# Patient Record
Sex: Female | Born: 1970 | Race: Black or African American | Hispanic: No | Marital: Single | Smoking: Never smoker
Health system: Southern US, Community
[De-identification: ages and names within clinical notes are randomized; demographics above are authoritative.]

## PROBLEM LIST (undated history)

## (undated) DIAGNOSIS — E785 Hyperlipidemia, unspecified: Secondary | ICD-10-CM

## (undated) DIAGNOSIS — I499 Cardiac arrhythmia, unspecified: Secondary | ICD-10-CM

## (undated) DIAGNOSIS — R011 Cardiac murmur, unspecified: Secondary | ICD-10-CM

## (undated) DIAGNOSIS — N189 Chronic kidney disease, unspecified: Secondary | ICD-10-CM

## (undated) DIAGNOSIS — E079 Disorder of thyroid, unspecified: Secondary | ICD-10-CM

## (undated) DIAGNOSIS — U071 COVID-19: Secondary | ICD-10-CM

## (undated) DIAGNOSIS — D649 Anemia, unspecified: Secondary | ICD-10-CM

## (undated) DIAGNOSIS — I10 Essential (primary) hypertension: Secondary | ICD-10-CM

## (undated) HISTORY — DX: Anemia, unspecified: D64.9

## (undated) HISTORY — DX: Cardiac murmur, unspecified: R01.1

## (undated) HISTORY — DX: Hyperlipidemia, unspecified: E78.5

## (undated) HISTORY — DX: Chronic kidney disease, unspecified: N18.9

## (undated) HISTORY — DX: Essential (primary) hypertension: I10

## (undated) HISTORY — DX: Disorder of thyroid, unspecified: E07.9

---

## 1997-03-06 HISTORY — PX: TOE SURGERY: SHX1073

## 1997-08-27 ENCOUNTER — Other Ambulatory Visit: Admission: RE | Admit: 1997-08-27 | Discharge: 1997-08-27 | Payer: Self-pay | Admitting: Obstetrics and Gynecology

## 1998-10-29 ENCOUNTER — Other Ambulatory Visit: Admission: RE | Admit: 1998-10-29 | Discharge: 1998-10-29 | Payer: Self-pay | Admitting: Obstetrics and Gynecology

## 1999-03-16 ENCOUNTER — Emergency Department (HOSPITAL_COMMUNITY): Admission: EM | Admit: 1999-03-16 | Discharge: 1999-03-16 | Payer: Self-pay | Admitting: Emergency Medicine

## 1999-07-11 ENCOUNTER — Emergency Department (HOSPITAL_COMMUNITY): Admission: EM | Admit: 1999-07-11 | Discharge: 1999-07-11 | Payer: Self-pay | Admitting: Emergency Medicine

## 2000-11-22 ENCOUNTER — Emergency Department (HOSPITAL_COMMUNITY): Admission: EM | Admit: 2000-11-22 | Discharge: 2000-11-22 | Payer: Self-pay | Admitting: Emergency Medicine

## 2001-07-03 ENCOUNTER — Emergency Department (HOSPITAL_COMMUNITY): Admission: EM | Admit: 2001-07-03 | Discharge: 2001-07-03 | Payer: Self-pay | Admitting: Emergency Medicine

## 2004-03-01 ENCOUNTER — Emergency Department: Payer: Self-pay | Admitting: Emergency Medicine

## 2004-03-01 ENCOUNTER — Ambulatory Visit: Payer: Self-pay | Admitting: Emergency Medicine

## 2005-01-19 ENCOUNTER — Emergency Department: Payer: Self-pay | Admitting: Emergency Medicine

## 2005-01-19 ENCOUNTER — Other Ambulatory Visit: Payer: Self-pay

## 2005-03-28 ENCOUNTER — Other Ambulatory Visit: Payer: Self-pay

## 2005-03-28 ENCOUNTER — Emergency Department: Payer: Self-pay | Admitting: Emergency Medicine

## 2005-06-22 ENCOUNTER — Emergency Department: Payer: Self-pay | Admitting: Emergency Medicine

## 2005-12-08 ENCOUNTER — Emergency Department: Payer: Self-pay | Admitting: Emergency Medicine

## 2005-12-11 ENCOUNTER — Emergency Department: Payer: Self-pay | Admitting: Unknown Physician Specialty

## 2005-12-19 ENCOUNTER — Emergency Department: Payer: Self-pay | Admitting: Emergency Medicine

## 2006-04-24 ENCOUNTER — Other Ambulatory Visit: Payer: Self-pay

## 2006-04-24 ENCOUNTER — Emergency Department: Payer: Self-pay | Admitting: Emergency Medicine

## 2006-04-26 ENCOUNTER — Ambulatory Visit: Payer: Self-pay | Admitting: Gastroenterology

## 2006-11-16 ENCOUNTER — Ambulatory Visit: Payer: Self-pay | Admitting: Family Medicine

## 2008-01-18 ENCOUNTER — Emergency Department: Payer: Self-pay | Admitting: Internal Medicine

## 2009-07-13 ENCOUNTER — Emergency Department: Payer: Self-pay | Admitting: Emergency Medicine

## 2009-08-17 ENCOUNTER — Encounter: Payer: Self-pay | Admitting: Orthopedic Surgery

## 2009-09-09 ENCOUNTER — Emergency Department: Payer: Self-pay | Admitting: Emergency Medicine

## 2010-02-08 ENCOUNTER — Emergency Department: Payer: Self-pay | Admitting: Internal Medicine

## 2011-06-16 ENCOUNTER — Emergency Department: Payer: Self-pay | Admitting: *Deleted

## 2011-10-27 DIAGNOSIS — E063 Autoimmune thyroiditis: Secondary | ICD-10-CM | POA: Insufficient documentation

## 2012-01-08 ENCOUNTER — Emergency Department: Payer: Self-pay | Admitting: Emergency Medicine

## 2013-11-25 ENCOUNTER — Emergency Department: Payer: Self-pay | Admitting: Emergency Medicine

## 2014-03-06 HISTORY — PX: CHOLECYSTECTOMY: SHX55

## 2014-03-11 ENCOUNTER — Ambulatory Visit: Payer: Self-pay | Admitting: Family Medicine

## 2014-04-14 DIAGNOSIS — R609 Edema, unspecified: Secondary | ICD-10-CM | POA: Insufficient documentation

## 2014-04-14 DIAGNOSIS — R600 Localized edema: Secondary | ICD-10-CM | POA: Insufficient documentation

## 2014-12-03 DIAGNOSIS — K760 Fatty (change of) liver, not elsewhere classified: Secondary | ICD-10-CM | POA: Insufficient documentation

## 2015-08-06 ENCOUNTER — Emergency Department
Admission: EM | Admit: 2015-08-06 | Discharge: 2015-08-06 | Disposition: A | Discharge status: Home / Self Care | Payer: BC Managed Care – PPO | Attending: Emergency Medicine | Admitting: Emergency Medicine

## 2015-08-06 DIAGNOSIS — J029 Acute pharyngitis, unspecified: Secondary | ICD-10-CM | POA: Insufficient documentation

## 2015-08-06 DIAGNOSIS — N898 Other specified noninflammatory disorders of vagina: Secondary | ICD-10-CM | POA: Diagnosis present

## 2015-08-06 DIAGNOSIS — R1030 Lower abdominal pain, unspecified: Secondary | ICD-10-CM | POA: Insufficient documentation

## 2015-08-06 LAB — CHLAMYDIA/NGC RT PCR (ARMC ONLY)
Chlamydia Tr: NOT DETECTED
N gonorrhoeae: NOT DETECTED

## 2015-08-06 LAB — URINALYSIS COMPLETE WITH MICROSCOPIC (ARMC ONLY)
BILIRUBIN URINE: NEGATIVE
Bacteria, UA: NONE SEEN
Glucose, UA: NEGATIVE mg/dL
KETONES UR: NEGATIVE mg/dL
Leukocytes, UA: NEGATIVE
NITRITE: NEGATIVE
Protein, ur: NEGATIVE mg/dL
Specific Gravity, Urine: 1.016 (ref 1.005–1.030)
WBC UA: NONE SEEN WBC/hpf (ref 0–5)
pH: 5 (ref 5.0–8.0)

## 2015-08-06 LAB — WET PREP, GENITAL
CLUE CELLS WET PREP: NONE SEEN
SPERM: NONE SEEN
TRICH WET PREP: NONE SEEN
WBC WET PREP: NONE SEEN
YEAST WET PREP: NONE SEEN

## 2015-08-06 LAB — POCT PREGNANCY, URINE: Preg Test, Ur: NEGATIVE

## 2015-08-06 MED ORDER — AZITHROMYCIN 500 MG PO TABS
1000.0000 mg | ORAL_TABLET | Freq: Every day | ORAL | Status: DC
  Administered 2015-08-06: 1000 mg via ORAL
  Filled 2015-08-06: qty 2

## 2015-08-06 MED ORDER — CEFTRIAXONE SODIUM 1 G IJ SOLR
500.0000 mg | Freq: Once | INTRAMUSCULAR | Status: AC
  Administered 2015-08-06: 500 mg via INTRAMUSCULAR
  Filled 2015-08-06: qty 10

## 2015-08-06 MED ORDER — LIDOCAINE HCL (PF) 1 % IJ SOLN
2.1000 mL | Freq: Once | INTRAMUSCULAR | Status: DC
  Filled 2015-08-06: qty 5

## 2015-08-06 NOTE — ED Notes (Signed)
Pt states that she thinks she may have an std, states watery discharge with some lower abd discomfort

## 2015-08-06 NOTE — Discharge Instructions (Signed)

## 2015-08-06 NOTE — ED Provider Notes (Signed)
[facility] Emergency Department Provider Note  ____________________________________________  Time seen: Approximately 3:08 PM  I have reviewed the triage vital signs and the nursing notes.   HISTORY  Chief Complaint Vaginal Discharge    HPI Kirsten Cruz is a 45 y.o. female who presents to the emergency department for evaluation of vaginal discharge. Potential STD. Watery discharge with some lower abdominal discomfort.She also has a sore throat and reports that she had oral sex with the same partner who potentially gave her an STD. She denies nausea, vomiting, or fever.   No past medical history on file.  There are no active problems to display for this patient.   No past surgical history on file.  No current outpatient prescriptions on file.  Allergies Morphine and related  No family history on file.  Social History Social History  Substance Use Topics  . Smoking status: Not on file  . Smokeless tobacco: Not on file  . Alcohol Use: Not on file    Review of Systems Constitutional: Negative for fever. Respiratory: Negative for shortness of breath or cough. Gastrointestinal: Positive for abdominal pain; negative for nausea , Negative for vomiting. Genitourinary: Negative for dysuria , positive for vaginal discharge. Musculoskeletal: Negative for back pain. Skin: Negative for rash. ____________________________________________   PHYSICAL EXAM:  VITAL SIGNS: ED Triage Vitals  Enc Vitals Group     BP 08/06/15 1448 124/78 mmHg     Pulse Rate 08/06/15 1448 76     Resp 08/06/15 1448 18     Temp 08/06/15 1448 99 F (37.2 C)     Temp Source 08/06/15 1448 Oral     SpO2 08/06/15 1448 99 %     Weight 08/06/15 1448 195 lb (88.451 kg)     Height 08/06/15 1448 5\' 5"  (1.651 m)     Head Cir --      Peak Flow --      Pain Score 08/06/15 1449 5     Pain Loc --      Pain Edu? --      Excl. in [place]? --     Constitutional: Alert and oriented.  Well appearing and in no acute distress. Eyes: Conjunctivae are normal. PERRL. EOMI. Head: Atraumatic. Nose: No congestion/rhinnorhea. Mouth/Throat: Mucous membranes are moist. Respiratory: Normal respiratory effort.  No retractions. Gastrointestinal: Suprapubic tenderness on palpation. Genitourinary: Pelvic exam: Yellow, thin discharge noted in vaginal vault. No exudate noted on vaginal walls. Cervix pink, non-friable with yellow discharge at os.  Musculoskeletal: No extremity tenderness nor edema.  Neurologic:  Normal speech and language. No gross focal neurologic deficits are appreciated. Speech is normal. No gait instability. Skin:  Skin is warm, dry and intact. No rash noted. Psychiatric: Mood and affect are normal. Speech and behavior are normal.  ____________________________________________   LABS (all labs ordered are listed, but only abnormal results are displayed)  Labs Reviewed  URINALYSIS COMPLETEWITH MICROSCOPIC ([place]) - Abnormal; Notable for the following:    Color, Urine YELLOW (*)    APPearance CLEAR (*)    Hgb urine dipstick 1+ (*)    Squamous Epithelial / LPF 0-5 (*)    All other components within normal limits  WET PREP, GENITAL  CHLAMYDIA/NGC RT PCR ([facility] ONLY)  GONOCOCCUS CULTURE  POC URINE PREG, ED  POCT PREGNANCY, URINE  POC URINE PREG, ED   ____________________________________________  RADIOLOGY   ____________________________________________   PROCEDURES  Procedure(s) performed:   ____________________________________________   INITIAL IMPRESSION / ASSESSMENT AND PLAN / ED  COURSE  Pertinent labs & imaging results that were available during my care of the patient were reviewed by me and considered in my medical decision making (see chart for details).  Wet prep and urinalysis unremarkable. Will treat with IM Rocephin and Azithromycin. GC Chlamydia culture sent to lab. Throat culture also requested.   She was advised to follow  up with PCP for symptoms that are not improving over the week. She was advised to return to the ER for symptoms that change or worsen if unable to schedule an appointment. ____________________________________________   FINAL CLINICAL IMPRESSION(S) / ED DIAGNOSES  Final diagnoses:  Vaginal discharge  Pharyngitis    Note:  This document was prepared using Dragon voice recognition software and may include unintentional dictation errors.   Victorino Dike, FNP 08/06/15 1649  Hinda Kehr, MD 08/06/15 2223

## 2015-08-09 LAB — GONOCOCCUS CULTURE: SPECIAL REQUESTS: NORMAL

## 2015-08-17 ENCOUNTER — Emergency Department
Admission: EM | Admit: 2015-08-17 | Discharge: 2015-08-17 | Disposition: A | Discharge status: Home / Self Care | Payer: BC Managed Care – PPO | Attending: Emergency Medicine | Admitting: Emergency Medicine

## 2015-08-17 ENCOUNTER — Emergency Department: Payer: BC Managed Care – PPO

## 2015-08-17 ENCOUNTER — Encounter: Payer: Self-pay | Admitting: Medical Oncology

## 2015-08-17 DIAGNOSIS — M549 Dorsalgia, unspecified: Secondary | ICD-10-CM | POA: Diagnosis present

## 2015-08-17 DIAGNOSIS — M546 Pain in thoracic spine: Secondary | ICD-10-CM

## 2015-08-17 HISTORY — DX: Cardiac arrhythmia, unspecified: I49.9

## 2015-08-17 LAB — COMPREHENSIVE METABOLIC PANEL
ALT: 13 U/L — ABNORMAL LOW (ref 14–54)
ANION GAP: 7 (ref 5–15)
AST: 18 U/L (ref 15–41)
Albumin: 3.6 g/dL (ref 3.5–5.0)
Alkaline Phosphatase: 59 U/L (ref 38–126)
BILIRUBIN TOTAL: 0.7 mg/dL (ref 0.3–1.2)
BUN: 12 mg/dL (ref 6–20)
CO2: 25 mmol/L (ref 22–32)
Calcium: 8.7 mg/dL — ABNORMAL LOW (ref 8.9–10.3)
Chloride: 105 mmol/L (ref 101–111)
Creatinine, Ser: 0.97 mg/dL (ref 0.44–1.00)
Glucose, Bld: 99 mg/dL (ref 65–99)
POTASSIUM: 3.5 mmol/L (ref 3.5–5.1)
Sodium: 137 mmol/L (ref 135–145)
TOTAL PROTEIN: 7.1 g/dL (ref 6.5–8.1)

## 2015-08-17 LAB — CBC
HEMATOCRIT: 35.8 % (ref 35.0–47.0)
Hemoglobin: 12.1 g/dL (ref 12.0–16.0)
MCH: 30.1 pg (ref 26.0–34.0)
MCHC: 33.8 g/dL (ref 32.0–36.0)
MCV: 89 fL (ref 80.0–100.0)
Platelets: 168 10*3/uL (ref 150–440)
RBC: 4.02 MIL/uL (ref 3.80–5.20)
RDW: 15.4 % — AB (ref 11.5–14.5)
WBC: 6.9 10*3/uL (ref 3.6–11.0)

## 2015-08-17 LAB — LIPASE, BLOOD: LIPASE: 26 U/L (ref 11–51)

## 2015-08-17 LAB — FIBRIN DERIVATIVES D-DIMER (ARMC ONLY): FIBRIN DERIVATIVES D-DIMER (ARMC): 300 (ref 0–499)

## 2015-08-17 LAB — TROPONIN I

## 2015-08-17 MED ORDER — CYCLOBENZAPRINE HCL 10 MG PO TABS: 10.0000 mg | ORAL_TABLET | Freq: Three times a day (TID) | ORAL | Status: AC | PRN

## 2015-08-17 MED ORDER — KETOROLAC TROMETHAMINE 30 MG/ML IJ SOLN
30.0000 mg | Freq: Once | INTRAMUSCULAR | Status: AC
  Administered 2015-08-17: 30 mg via INTRAVENOUS
  Filled 2015-08-17: qty 1

## 2015-08-17 MED ORDER — NAPROXEN 500 MG PO TABS: 500.0000 mg | ORAL_TABLET | Freq: Two times a day (BID) | ORAL | Status: DC

## 2015-08-17 MED ORDER — SODIUM CHLORIDE 0.9 % IV SOLN
1000.0000 mL | Freq: Once | INTRAVENOUS | Status: AC
  Administered 2015-08-17: 1000 mL via INTRAVENOUS

## 2015-08-17 NOTE — ED Provider Notes (Signed)
[facility] Emergency Department Provider Note  ____________________________________________    I have reviewed the triage vital signs and the nursing notes.   HISTORY  Chief Complaint Back Pain and Dizziness    HPI Kirsten Cruz is a 45 y.o. female who presents with right upper back pain. Patient reports she felt a burning in her right shoulder blade area last night, it was mild so she didn't think anything of it and went to sleep. She reports today she continues to have mild pain in the right shoulder blade area, she had a brief episode of radiation into the right posterior neck region denies chest pain. No pleurisy. No shortness of breath. No chest pain. Patient reports she felt lightheaded when she got a shower but feels better now. No recent travel. No calf pain or swelling. She has had a cholecystectomy in the past. No abdominal pain   Past Medical History  Diagnosis Date  . Irregular heart beat     There are no active problems to display for this patient.   No past surgical history on file.  No current outpatient prescriptions on file.  Allergies Morphine and related  No family history on file.  Social History Social History  Substance Use Topics  . Smoking status: Never Smoker   . Smokeless tobacco: None  . Alcohol Use: No    Review of Systems  Constitutional: Negative for fever. Eyes: Negative for redness ENT: Negative for sore throat Cardiovascular: Negative for chest pain Respiratory: Negative for shortness of breath. Gastrointestinal: Negative for abdominal pain Genitourinary: Negative for dysuria. Musculoskeletal: As above Skin: Negative for rash. Neurological: Negative for focal weakness Psychiatric: Mild anxiety    ____________________________________________   PHYSICAL EXAM:  VITAL SIGNS: ED Triage Vitals  Enc Vitals Group     BP 08/17/15 0803 127/82 mmHg     Pulse Rate 08/17/15 0803 67     Resp 08/17/15  0803 16     Temp 08/17/15 0803 98 F (36.7 C)     Temp Source 08/17/15 0803 Oral     SpO2 08/17/15 0803 100 %     Weight 08/17/15 0803 195 lb (88.451 kg)     Height 08/17/15 0803 5\' 5"  (1.651 m)     Head Cir --      Peak Flow --      Pain Score 08/17/15 0804 7     Pain Loc --      Pain Edu? --      Excl. in [place]? --      Constitutional: Alert and oriented. Well appearing and in no distress.  Eyes: Conjunctivae are normal. No erythema or injection ENT   Head: Normocephalic and atraumatic.   Mouth/Throat: Mucous membranes are moist. Cardiovascular: Normal rate, regular rhythm. Normal and symmetric distal pulses are present in the upper extremities.  Respiratory: Normal respiratory effort without tachypnea nor retractions. Breath sounds are clear and equal bilaterally.  Gastrointestinal: Soft and non-tender in all quadrants. No distention. There is no CVA tenderness. Genitourinary: deferred Musculoskeletal: Nontender with normal range of motion in all extremities. No tenderness to palpation along the scapula or paraspinally. No obvious muscle spasm. No pain with range of motion of right arm Neurologic:  Normal speech and language. No gross focal neurologic deficits are appreciated. Skin:  Skin is warm, dry and intact. No rash noted. Psychiatric: Mood and affect are normal. Patient exhibits appropriate insight and judgment.  ____________________________________________    LABS (pertinent positives/negatives)  Labs Reviewed  CBC  COMPREHENSIVE METABOLIC PANEL  TROPONIN I  LIPASE, BLOOD  FIBRIN DERIVATIVES D-DIMER ([facility] ONLY)    ____________________________________________   EKG  ED ECG REPORT I, Lavonia Drafts, the attending physician, personally viewed and interpreted this ECG.  Date: 08/17/2015 EKG Time: 8:50 AM Rate: 63 Rhythm: normal sinus rhythm QRS Axis: normal Intervals: normal ST/T Wave abnormalities: normal Conduction Disturbances: none Narrative  Interpretation: unremarkable   ____________________________________________    RADIOLOGY  Chest x-ray normal  ____________________________________________   PROCEDURES  Procedure(s) performed: none  Critical Care performed: none  ____________________________________________   INITIAL IMPRESSION / ASSESSMENT AND PLAN / ED COURSE  Pertinent labs & imaging results that were available during my care of the patient were reviewed by me and considered in my medical decision making (see chart for details).  Patient presents with somewhat vague right upper back pain. Overall she is well-appearing. Not consistent with ACS, nor dissection given no chest pain and well-appearing, normal EKG and young age. Unlikely to be PE but we will send d-dimer.  Patient's workup was unremarkable. She improved with Toradol IV. Suspect musculoskeletal cause of her back pain. She has follow-up with her PCP. We discussed return precautions including any development of chest pain or shortness of breath or worsening pain  ____________________________________________   FINAL CLINICAL IMPRESSION(S) / ED DIAGNOSES  Final diagnoses:  Right-sided thoracic back pain          Lavonia Drafts, MD 08/17/15 1404

## 2015-08-17 NOTE — ED Notes (Signed)
Pt reports she began having a pain to the rt shoulder blade that radiates into rt arm and neck last night. Pt reports that she feels nauseous and like she is going to "pass out".

## 2015-08-17 NOTE — Discharge Instructions (Signed)

## 2015-12-22 ENCOUNTER — Other Ambulatory Visit: Payer: Self-pay | Admitting: Family Medicine

## 2015-12-22 DIAGNOSIS — Z1231 Encounter for screening mammogram for malignant neoplasm of breast: Secondary | ICD-10-CM

## 2015-12-23 DIAGNOSIS — K769 Liver disease, unspecified: Secondary | ICD-10-CM | POA: Insufficient documentation

## 2016-01-04 ENCOUNTER — Ambulatory Visit
Admission: RE | Admit: 2016-01-04 | Discharge: 2016-01-04 | Disposition: A | Discharge status: Home / Self Care | Payer: BC Managed Care – PPO | Source: Ambulatory Visit | Attending: Family Medicine | Admitting: Family Medicine

## 2016-01-04 DIAGNOSIS — Z1231 Encounter for screening mammogram for malignant neoplasm of breast: Secondary | ICD-10-CM | POA: Diagnosis not present

## 2016-08-03 DIAGNOSIS — F419 Anxiety disorder, unspecified: Secondary | ICD-10-CM | POA: Insufficient documentation

## 2016-08-07 ENCOUNTER — Other Ambulatory Visit: Payer: Self-pay | Admitting: Gastroenterology

## 2016-08-07 DIAGNOSIS — R935 Abnormal findings on diagnostic imaging of other abdominal regions, including retroperitoneum: Secondary | ICD-10-CM

## 2016-08-07 DIAGNOSIS — K760 Fatty (change of) liver, not elsewhere classified: Secondary | ICD-10-CM

## 2016-08-16 ENCOUNTER — Ambulatory Visit: Payer: BC Managed Care – PPO

## 2016-08-31 ENCOUNTER — Ambulatory Visit: Payer: BC Managed Care – PPO

## 2016-11-24 ENCOUNTER — Encounter (INDEPENDENT_AMBULATORY_CARE_PROVIDER_SITE_OTHER): Payer: Self-pay | Admitting: Vascular Surgery

## 2016-12-26 ENCOUNTER — Encounter (INDEPENDENT_AMBULATORY_CARE_PROVIDER_SITE_OTHER): Payer: Self-pay | Admitting: Vascular Surgery

## 2016-12-26 LAB — HM PAP SMEAR: HM Pap smear: NEGATIVE

## 2016-12-27 DIAGNOSIS — N183 Chronic kidney disease, stage 3 unspecified: Secondary | ICD-10-CM | POA: Insufficient documentation

## 2016-12-27 DIAGNOSIS — E7800 Pure hypercholesterolemia, unspecified: Secondary | ICD-10-CM | POA: Insufficient documentation

## 2017-02-09 ENCOUNTER — Other Ambulatory Visit: Payer: Self-pay | Admitting: Family Medicine

## 2017-02-09 DIAGNOSIS — Z1231 Encounter for screening mammogram for malignant neoplasm of breast: Secondary | ICD-10-CM

## 2017-03-12 ENCOUNTER — Ambulatory Visit
Admission: RE | Admit: 2017-03-12 | Discharge: 2017-03-12 | Disposition: A | Discharge status: Home / Self Care | Payer: BC Managed Care – PPO | Source: Ambulatory Visit | Attending: Family Medicine | Admitting: Family Medicine

## 2017-03-12 DIAGNOSIS — Z1231 Encounter for screening mammogram for malignant neoplasm of breast: Secondary | ICD-10-CM | POA: Diagnosis present

## 2017-07-05 ENCOUNTER — Other Ambulatory Visit: Payer: Self-pay

## 2017-07-05 ENCOUNTER — Ambulatory Visit (INDEPENDENT_AMBULATORY_CARE_PROVIDER_SITE_OTHER): Payer: BC Managed Care – PPO | Admitting: Vascular Surgery

## 2017-07-05 ENCOUNTER — Encounter (INDEPENDENT_AMBULATORY_CARE_PROVIDER_SITE_OTHER): Payer: Self-pay | Admitting: Vascular Surgery

## 2017-07-05 VITALS — BP 111/65 | HR 74 | Resp 20 | Ht 65.0 in | Wt 200.0 lb

## 2017-07-05 DIAGNOSIS — E049 Nontoxic goiter, unspecified: Secondary | ICD-10-CM | POA: Insufficient documentation

## 2017-07-05 DIAGNOSIS — E7800 Pure hypercholesterolemia, unspecified: Secondary | ICD-10-CM | POA: Diagnosis not present

## 2017-07-05 DIAGNOSIS — E785 Hyperlipidemia, unspecified: Secondary | ICD-10-CM | POA: Insufficient documentation

## 2017-07-05 DIAGNOSIS — R609 Edema, unspecified: Secondary | ICD-10-CM

## 2017-07-05 DIAGNOSIS — G43909 Migraine, unspecified, not intractable, without status migrainosus: Secondary | ICD-10-CM | POA: Insufficient documentation

## 2017-07-05 DIAGNOSIS — N183 Chronic kidney disease, stage 3 unspecified: Secondary | ICD-10-CM

## 2017-07-05 DIAGNOSIS — M79604 Pain in right leg: Secondary | ICD-10-CM

## 2017-07-05 DIAGNOSIS — B009 Herpesviral infection, unspecified: Secondary | ICD-10-CM | POA: Insufficient documentation

## 2017-07-05 DIAGNOSIS — M79605 Pain in left leg: Secondary | ICD-10-CM

## 2017-07-05 NOTE — Progress Notes (Signed)
Subjective:    Patient ID: Kirsten Cruz, female    DOB: January 13, 1971, 47 y.o.   MRN: [id number] Chief Complaint  Patient presents with  . Leg Swelling    bilateral   Presents as a new patient referred by Dr. Astrid Divine for evaluation of bilateral lower extremity edema and discomfort.  The patient endorses a history of 7 to 8 months of progressively worsening discomfort to the bilateral lower extremity.  The patient's discomfort is located behind the knees to the proximal calf.  The patient notes that her discomfort occurs when she experiences swelling to the bilateral legs.  The patient notes her swelling occurs throughout the day worsening towards the evening, after working out or sitting or standing for long periods of time.  At this time, the patient does not engage in conservative therapy including wearing a grade 1 compression socks.  The patient does elevate her legs.  The patient denies any recent trauma or surgery to the bilateral lower extremity.  The patient denies any claudication-like symptoms, rest pain or ulceration to the bilateral lower extremity.  The patient denies any fever, nausea vomiting.  The patient does feel that her symptoms have progressed to the point that they have become lifestyle limiting.  This is what prompted her to seek medical attention.  Review of Systems  Constitutional: Negative.   HENT: Negative.   Eyes: Negative.   Respiratory: Negative.   Cardiovascular: Positive for leg swelling.       Lower extremity discomfort  Gastrointestinal: Negative.   Endocrine: Negative.   Genitourinary: Negative.   Musculoskeletal: Negative.   Skin: Negative.   Allergic/Immunologic: Negative.   Neurological: Negative.   Hematological: Negative.   Psychiatric/Behavioral: Negative.       Objective:   Physical Exam  Constitutional: She is oriented to person, place, and time. She appears well-developed and well-nourished. No distress.  HENT:  Head: Normocephalic and  atraumatic.  Right Ear: External ear normal.  Left Ear: External ear normal.  Eyes: Pupils are equal, round, and reactive to light. Conjunctivae and EOM are normal.  Neck: Normal range of motion.  Cardiovascular: Normal rate, regular rhythm, normal heart sounds and intact distal pulses.  Pulses:      Radial pulses are 2+ on the right side, and 2+ on the left side.       Dorsalis pedis pulses are 2+ on the right side, and 2+ on the left side.       Posterior tibial pulses are 2+ on the right side, and 2+ on the left side.  Pulmonary/Chest: Effort normal and breath sounds normal.  Musculoskeletal: Normal range of motion. She exhibits edema (Minimal nonpitting edema noted bilaterally).  Neurological: She is alert and oriented to person, place, and time.  Skin: Skin is warm and dry. Capillary refill takes 2 to 3 seconds. She is not diaphoretic.  Less than 1 cm scattered varicosities to the bilateral lower extremity.  Mostly located to the back of the knees.  There is no stasis dermatitis, skin changes, cellulitis or open ulcerations to the bilateral lower extremity.  Psychiatric: She has a normal mood and affect. Her behavior is normal. Judgment and thought content normal.  Vitals reviewed.  BP 111/65 (BP Location: Right Arm)   Pulse 74   Resp 20   Ht 5\' 5"  (1.651 m)   Wt 200 lb (90.7 kg)   BMI 33.28 kg/m   Past Medical History:  Diagnosis Date  . Anemia   . Chronic kidney disease   .  Heart murmur    many years ago and her cardiologist Dr. Clayborn Bigness is managing this  . Hyperlipidemia   . Hypertension   . Irregular heart beat   . Thyroid disease    Social History   Socioeconomic History  . Marital status: Single    Spouse name: Not on file  . Number of children: Not on file  . Years of education: Not on file  . Highest education level: Not on file  Occupational History  . Not on file  Social Needs  . Financial resource strain: Not on file  . Food insecurity:    Worry: Not  on file    Inability: Not on file  . Transportation needs:    Medical: Not on file    Non-medical: Not on file  Tobacco Use  . Smoking status: Never Smoker  . Smokeless tobacco: Never Used  Substance and Sexual Activity  . Alcohol use: No  . Drug use: Not on file  . Sexual activity: Not on file  Lifestyle  . Physical activity:    Days per week: Not on file    Minutes per session: Not on file  . Stress: Not on file  Relationships  . Social connections:    Talks on phone: Not on file    Gets together: Not on file    Attends religious service: Not on file    Active member of club or organization: Not on file    Attends meetings of clubs or organizations: Not on file    Relationship status: Not on file  . Intimate partner violence:    Fear of current or ex partner: Not on file    Emotionally abused: Not on file    Physically abused: Not on file    Forced sexual activity: Not on file  Other Topics Concern  . Not on file  Social History Narrative  . Not on file   Past Surgical History:  Procedure Laterality Date  . CESAREAN SECTION  02/11/1997  . CHOLECYSTECTOMY  2016  . TOE SURGERY Left 1999   Family History  Problem Relation Age of Onset  . Breast cancer Mother 67  . Cancer Mother   . Diabetes Mother   . Varicose Veins Mother   . Cancer Father   . Diabetes Sister    Allergies  Allergen Reactions  . Buspirone Nausea Only    Other reaction(s): Headache  . Pravastatin Other (See Comments)    Pt believes she had a seizure with it.  . Morphine Rash  . Morphine And Related Rash      Assessment & Plan:  Presents as a new patient referred by Dr. Astrid Divine for evaluation of bilateral lower extremity edema and discomfort.  The patient endorses a history of 7 to 8 months of progressively worsening discomfort to the bilateral lower extremity.  The patient's discomfort is located behind the knees to the proximal calf.  The patient notes that her discomfort occurs when she  experiences swelling to the bilateral legs.  The patient notes her swelling occurs throughout the day worsening towards the evening, after working out or sitting or standing for long periods of time.  At this time, the patient does not engage in conservative therapy including wearing a grade 1 compression socks.  The patient does elevate her legs.  The patient denies any recent trauma or surgery to the bilateral lower extremity.  The patient denies any claudication-like symptoms, rest pain or ulceration to the bilateral lower extremity.  The patient denies any fever, nausea vomiting.  The patient does feel that her symptoms have progressed to the point that they have become lifestyle limiting.  This is what prompted her to seek medical attention.  1. Edema, peripheral - New The patient was encouraged to wear graduated compression stockings (20-30 mmHg) on a daily basis. The patient was instructed to begin wearing the stockings first thing in the morning and removing them in the evening. The patient was instructed specifically not to sleep in the stockings. Prescription given.  In addition, behavioral modification including elevation during the day will be initiated. Anti-inflammatories for pain. The patient will follow up in three months to asses conservative management.  Information on compression stockings was given to the patient. The patient was instructed to call the office in the interim if any worsening edema or ulcerations to the legs, feet or toes occurs. The patient expresses their understanding.  - VAS [place] LOWER EXTREMITY VENOUS REFLUX; Future  2. Lower extremity pain, bilateral - New I do not feel the patient's discomfort is from arterial insufficiency as the patient has 2+ pedal pulses to her bilateral feet and her symptoms do not sound ischemic in nature.  3. CKD (chronic kidney disease) stage 3, GFR 30-59 ml/min (HCC) - Stable Encouraged good hypertension control as this directly  relates to worsening kidney function  4. Pure hypercholesterolemia - Stable On statin Encouraged good control as its slows the progression of atherosclerotic disease  5. Hyperlipidemia, unspecified hyperlipidemia type - Stable On statin Encouraged good control as its slows the progression of atherosclerotic disease  Current Outpatient Medications on File Prior to Visit  Medication Sig Dispense Refill  . B Complex Vitamins (B-COMPLEX/B-12) TABS Per MD, to help with trending lower b12 levels    . clonazePAM (KLONOPIN) 0.5 MG tablet TAKE 1 TABLET BY MOUTH ONCE A DAY AS NEEDED FOR ANXIETY FOR UP TO 1 DOSE    . cyclobenzaprine (FLEXERIL) 10 MG tablet cyclobenzaprine 10 mg tablet    . metoprolol tartrate (LOPRESSOR) 25 MG tablet Take by mouth.    . SUMAtriptan (IMITREX) 100 MG tablet Take by mouth.    . triamterene-hydrochlorothiazide (DYAZIDE) 37.5-25 MG capsule Take by mouth.    . valACYclovir (VALTREX) 500 MG tablet One po bid for flare x 3 days    . atorvastatin (LIPITOR) 40 MG tablet Take 40 mg by mouth daily.  3  . escitalopram (LEXAPRO) 10 MG tablet   11  . metroNIDAZOLE (METROGEL) 0.75 % vaginal gel metronidazole 0.75 % vaginal gel    . naproxen (NAPROSYN) 500 MG tablet Take 1 tablet (500 mg total) by mouth 2 (two) times daily with a meal. 20 tablet 2  . SUMAtriptan (IMITREX) 100 MG tablet   10   No current facility-administered medications on file prior to visit.    There are no Patient Instructions on file for this visit. No follow-ups on file.  Thaddus Mcdowell A Mireya Meditz, PA-C

## 2017-07-27 ENCOUNTER — Ambulatory Visit (INDEPENDENT_AMBULATORY_CARE_PROVIDER_SITE_OTHER): Payer: BC Managed Care – PPO | Admitting: Obstetrics and Gynecology

## 2017-07-27 ENCOUNTER — Encounter: Payer: Self-pay | Admitting: Obstetrics and Gynecology

## 2017-07-27 VITALS — BP 118/70 | HR 71 | Ht 65.0 in | Wt 199.0 lb

## 2017-07-27 DIAGNOSIS — N920 Excessive and frequent menstruation with regular cycle: Secondary | ICD-10-CM | POA: Diagnosis not present

## 2017-07-27 DIAGNOSIS — Z0000 Encounter for general adult medical examination without abnormal findings: Secondary | ICD-10-CM

## 2017-07-27 DIAGNOSIS — Z01419 Encounter for gynecological examination (general) (routine) without abnormal findings: Secondary | ICD-10-CM | POA: Diagnosis not present

## 2017-07-27 HISTORY — PX: OTHER SURGICAL HISTORY: SHX169

## 2017-07-27 MED ORDER — LEVONORGESTREL 20 MCG/24HR IU IUD: 1.0000 | INTRAUTERINE_SYSTEM | Freq: Once | INTRAUTERINE | 0 refills | Status: DC

## 2017-07-27 NOTE — Progress Notes (Signed)
Gynecology Annual Exam  PCP: Gayland Curry, MD  Chief Complaint  Patient presents with  . Contraception    [facility] conference   Annual gynecologic exam  History of Present Illness:  [name] is a 47 y.o. [phone] who LMP was Patient's last menstrual period was 07/09/2017 (exact date)., presents today for her annual examination.   She does not have vasomotor sx.   Hx of STDs: HSV, distant history of genital warts  Last mammogram: 1 year  Results were: normal--routine follow-up in 12 months There is a FH of breast cancer in her mother (age 37). There is no FH of ovarian cancer. The patient does do self-breast exams.  Tobacco use: The patient denies current or previous tobacco use. Alcohol use: social drinker Exercise: moderately active  The patient wears seatbelts: yes.     Paitient is a 47 y.o. [id number] who LMP was Patient's last menstrual period was 07/09/2017 (exact date)., presents today for a problem visit.  Her periods are irregular. It is hard to predict when it the menses will come.  But, they usually come monthly.  They will last 7-10 days.  She passes clots.  She uses 2-3 pads per day. She denies intermenstrual spotting.   Previous evaluation: 3 years. Prior Diagnosis: abnormal bleeding of unknown origin. Previous Treatment: NuvaRing, patch, pills.  LMP: Patient's last menstrual period was 07/09/2017 (exact date). Menarche:12 Postcoital Bleeding: no Dysmenorrhea: no  Last pap smear was 12/2016, NILM, HPV negative Ultrasound in late 2016 was not suggestive of any particular etiology.  The patient has not had sex since before her last menses.   She gets her breast exams at [facility] in [place].    Past Medical History:  Diagnosis Date  . Anemia   . Chronic kidney disease   . Heart murmur    many years ago and her cardiologist Dr. Clayborn Bigness is managing this  . Hyperlipidemia   . Hypertension   . Irregular heart beat   . Thyroid disease    Past  Surgical History:  Procedure Laterality Date  . CESAREAN SECTION  02/11/1997  . CHOLECYSTECTOMY  2016  . TOE SURGERY Left 1999   Prior to Admission medications   Medication Sig Start Date End Date Taking? Authorizing Provider  atorvastatin (LIPITOR) 40 MG tablet Take 40 mg by mouth daily. 06/19/17  Yes [provider]  clonazePAM (KLONOPIN) 0.5 MG tablet TAKE 1 TABLET BY MOUTH ONCE A DAY AS NEEDED FOR ANXIETY FOR UP TO 1 DOSE 11/16/16 09/30/17 Yes [provider]  metoprolol tartrate (LOPRESSOR) 25 MG tablet Take by mouth. 12/26/16 12/26/17 Yes [provider]  ondansetron (ZOFRAN-ODT) 4 MG disintegrating tablet DIS 1 TO 2 TS ON THE TONGUE TID PRF NAUSEA 07/16/17  Yes [provider]  SUMAtriptan (IMITREX) 100 MG tablet Take by mouth. 04/14/14  Yes [provider]  SUMAtriptan (IMITREX) 100 MG tablet  06/23/17  Yes [provider]  topiramate (TOPAMAX) 50 MG tablet Take by mouth. 07/16/17 07/16/18 Yes [provider]  triamterene-hydrochlorothiazide (DYAZIDE) 37.5-25 MG capsule Take by mouth. 12/26/16 12/26/17 Yes [provider]  valACYclovir (VALTREX) 500 MG tablet One po bid for flare x 3 days 04/06/17  Yes [provider]  B Complex Vitamins (B-COMPLEX/B-12) TABS Per MD, to help with trending lower b12 levels 06/28/17   [provider]  cyclobenzaprine (FLEXERIL) 10 MG tablet cyclobenzaprine 10 mg tablet 03/07/16   [provider]  escitalopram (LEXAPRO) 10 MG tablet  06/27/17  [provider]  metroNIDAZOLE (METROGEL) 0.75 % vaginal gel metronidazole 0.75 % vaginal gel    [provider]   Allergies  Allergen Reactions  . Buspirone Nausea Only    Other reaction(s): Headache  . Pravastatin Other (See Comments)    Pt believes she had a seizure with it.  . Morphine Rash  . Morphine And Related Rash   Obstetric History: [id number]  Family History  Problem Relation Age of Onset  .  Breast cancer Mother 72  . Cancer Mother   . Diabetes Mother   . Varicose Veins Mother   . Cancer Father   . Diabetes Sister     Social History   Socioeconomic History  . Marital status: Single    Spouse name: Not on file  . Number of children: Not on file  . Years of education: Not on file  . Highest education level: Not on file  Occupational History  . Not on file  Social Needs  . Financial resource strain: Not on file  . Food insecurity:    Worry: Not on file    Inability: Not on file  . Transportation needs:    Medical: Not on file    Non-medical: Not on file  Tobacco Use  . Smoking status: Never Smoker  . Smokeless tobacco: Never Used  Substance and Sexual Activity  . Alcohol use: No  . Drug use: Never  . Sexual activity: Not Currently    Birth control/protection: Condom  Lifestyle  . Physical activity:    Days per week: Not on file    Minutes per session: Not on file  . Stress: Not on file  Relationships  . Social connections:    Talks on phone: Not on file    Gets together: Not on file    Attends religious service: Not on file    Active member of club or organization: Not on file    Attends meetings of clubs or organizations: Not on file    Relationship status: Not on file  . Intimate partner violence:    Fear of current or ex partner: Not on file    Emotionally abused: Not on file    Physically abused: Not on file    Forced sexual activity: Not on file  Other Topics Concern  . Not on file  Social History Narrative  . Not on file    Review of Systems  Constitutional: Negative.   HENT: Negative.   Eyes: Negative.   Respiratory: Negative.   Cardiovascular: Negative.   Gastrointestinal: Negative.   Genitourinary: Negative.   Musculoskeletal: Negative.   Skin: Negative.   Neurological: Negative.   Psychiatric/Behavioral: Negative.      Physical Exam BP 118/70   Pulse 71   Ht 5\' 5"  (1.651 m)   Wt 199 lb (90.3 kg)   LMP 07/09/2017 (Exact  Date)   BMI 33.12 kg/m   Physical Exam  Constitutional: She is oriented to person, place, and time. She appears well-developed and well-nourished. No distress.  Genitourinary: Vagina normal and uterus normal. Pelvic exam was performed with patient supine. There is no rash, tenderness or lesion on the right labia. There is no rash, tenderness or lesion on the left labia. Right adnexum does not display mass, does not display tenderness and does not display fullness. Left adnexum does not display mass, does not display tenderness and does not display fullness. Cervix does not exhibit motion tenderness, lesion or polyp.   Uterus is mobile and anteverted.  Uterus is not enlarged, tender, exhibiting a mass or irregular (is regular).  HENT:  Head: Normocephalic and atraumatic.  Eyes: EOM are normal. No scleral icterus.  Neck: Normal range of motion. Neck supple. No thyromegaly present.  Cardiovascular: Normal rate and regular rhythm.  Pulmonary/Chest: Effort normal and breath sounds normal. No respiratory distress. She has no wheezes. She has no rales.  Abdominal: Soft. Bowel sounds are normal. She exhibits no distension and no mass. There is no tenderness. There is no rebound and no guarding.  Musculoskeletal: Normal range of motion. She exhibits no edema.  Lymphadenopathy:    She has no cervical adenopathy.  Neurological: She is alert and oriented to person, place, and time. No cranial nerve deficit.  Skin: Skin is warm and dry. No erythema.  Psychiatric: She has a normal mood and affect. Her behavior is normal. Judgment normal.   IUD Insertion Procedure Note (Mirena) Patient identified, informed consent performed, consent signed.   Discussed risks of irregular bleeding, cramping, infection, malpositioning, expulsion or uterine perforation of the IUD (1:1000 placements)  which may require further procedure such as laparoscopy.  IUD while effective at preventing pregnancy do not prevent transmission  of sexually transmitted diseases and use of barrier methods for this purpose was discussed. Time out was performed.  Urine pregnancy test negative.  Speculum placed in the vagina.  Cervix visualized.  Cleaned with Betadine x 2.  Grasped anteriorly with a single tooth tenaculum.  Uterus sounded to 9 cm. IUD placed per manufacturer's recommendations.  Strings trimmed to 3 cm. Tenaculum was removed, good hemostasis noted.  Patient tolerated procedure well.   Patient was given post-procedure instructions.  She was advised to have backup contraception for one week.  Patient was also asked to check IUD strings periodically and follow up in 4 weeks for IUD check.   Female chaperone present for pelvic and breast  portions of the physical exam  Assessment: 47 y.o. [id number] female here for routine gynecologic examination, menorrhagia with resultant anemia.  Plan: Problem List Items Addressed This Visit      Other   Menorrhagia with regular cycle (Chronic)    Other Visit Diagnoses    Women's annual routine gynecological examination    -  Primary     Screening: -- Blood pressure screen managed by PCP -- Colonoscopy - not due -- Mammogram - done in 03/2017, Birads 1, managed by PCP -- Weight screening: managed by PCP -- Depression screening negative (PHQ-9) -- Nutrition: normal -- cholesterol screening: per PCP -- osteoporosis screening: not due -- tobacco screening: not using -- alcohol screening: AUDIT questionnaire indicates low-risk usage. -- family history of breast cancer screening: done. not at high risk. -- no evidence of domestic violence or intimate partner violence. -- STD screening: gonorrhea/chlamydia NAAT not collected per patient request. -- pap smear not collected per ASCCP guidelines -- HPV vaccination series: not eligilbe   Menorrhagia: IUD placed today after discussion of options to treat heavy bleeding. Precautions given.  Will see her in follow-up in one month to check  strings and response to therapy.   Prentice Docker, MD 07/27/2017 5:32 PM

## 2017-07-27 NOTE — Progress Notes (Deleted)
Obstetrics & Gynecology Office Visit   Chief Complaint  Patient presents with  . Contraception    [facility] conference    History of Present Illness:     Past Medical History:  Diagnosis Date  . Anemia   . Chronic kidney disease   . Heart murmur    many years ago and her cardiologist Dr. Clayborn Bigness is managing this  . Hyperlipidemia   . Hypertension   . Irregular heart beat   . Thyroid disease     Past Surgical History:  Procedure Laterality Date  . CESAREAN SECTION  02/11/1997  . CHOLECYSTECTOMY  2016  . TOE SURGERY Left 1999    Gynecologic History: Patient's last menstrual period was 07/09/2017 (exact date).  Obstetric History: [id number]  Family History  Problem Relation Age of Onset  . Breast cancer Mother 11  . Cancer Mother   . Diabetes Mother   . Varicose Veins Mother   . Cancer Father   . Diabetes Sister     Social History   Socioeconomic History  . Marital status: Single    Spouse name: Not on file  . Number of children: Not on file  . Years of education: Not on file  . Highest education level: Not on file  Occupational History  . Not on file  Social Needs  . Financial resource strain: Not on file  . Food insecurity:    Worry: Not on file    Inability: Not on file  . Transportation needs:    Medical: Not on file    Non-medical: Not on file  Tobacco Use  . Smoking status: Never Smoker  . Smokeless tobacco: Never Used  Substance and Sexual Activity  . Alcohol use: No  . Drug use: Never  . Sexual activity: Not Currently    Birth control/protection: Condom  Lifestyle  . Physical activity:    Days per week: Not on file    Minutes per session: Not on file  . Stress: Not on file  Relationships  . Social connections:    Talks on phone: Not on file    Gets together: Not on file    Attends religious service: Not on file    Active member of club or organization: Not on file    Attends meetings of clubs or organizations: Not on file   Relationship status: Not on file  . Intimate partner violence:    Fear of current or ex partner: Not on file    Emotionally abused: Not on file    Physically abused: Not on file    Forced sexual activity: Not on file  Other Topics Concern  . Not on file  Social History Narrative  . Not on file    Allergies  Allergen Reactions  . Buspirone Nausea Only    Other reaction(s): Headache  . Pravastatin Other (See Comments)    Pt believes she had a seizure with it.  . Morphine Rash  . Morphine And Related Rash    Prior to Admission medications   Medication Sig Start Date End Date Taking? Authorizing Provider  atorvastatin (LIPITOR) 40 MG tablet Take 40 mg by mouth daily. 06/19/17  Yes [provider]  clonazePAM (KLONOPIN) 0.5 MG tablet TAKE 1 TABLET BY MOUTH ONCE A DAY AS NEEDED FOR ANXIETY FOR UP TO 1 DOSE 11/16/16 09/30/17 Yes [provider]  metoprolol tartrate (LOPRESSOR) 25 MG tablet Take by mouth. 12/26/16 12/26/17 Yes [provider]  ondansetron (ZOFRAN-ODT) 4 MG disintegrating tablet DIS  1 TO 2 TS ON THE TONGUE TID PRF NAUSEA 07/16/17  Yes [provider]  SUMAtriptan (IMITREX) 100 MG tablet Take by mouth. 04/14/14  Yes [provider]  SUMAtriptan (IMITREX) 100 MG tablet  06/23/17  Yes [provider]  topiramate (TOPAMAX) 50 MG tablet Take by mouth. 07/16/17 07/16/18 Yes [provider]  triamterene-hydrochlorothiazide (DYAZIDE) 37.5-25 MG capsule Take by mouth. 12/26/16 12/26/17 Yes [provider]  valACYclovir (VALTREX) 500 MG tablet One po bid for flare x 3 days 04/06/17  Yes [provider]  B Complex Vitamins (B-COMPLEX/B-12) TABS Per MD, to help with trending lower b12 levels 06/28/17   [provider]  cyclobenzaprine (FLEXERIL) 10 MG tablet cyclobenzaprine 10 mg tablet 03/07/16   [provider]  escitalopram (LEXAPRO) 10 MG tablet  06/27/17   [provider]  metroNIDAZOLE  (METROGEL) 0.75 % vaginal gel metronidazole 0.75 % vaginal gel    [provider]  naproxen (NAPROSYN) 500 MG tablet Take 1 tablet (500 mg total) by mouth 2 (two) times daily with a meal. Patient not taking: Reported on 07/27/2017 08/17/15   Lavonia Drafts, MD    ROS   Physical Exam BP 118/70   Pulse 71   Ht 5\' 5"  (1.651 m)   Wt 199 lb (90.3 kg)   LMP 07/09/2017 (Exact Date)   BMI 33.12 kg/m  Patient's last menstrual period was 07/09/2017 (exact date). OBGyn Exam  Female chaperone present for pelvic and breast  portions of the physical exam  Assessment: 47 y.o. [id number] female here for No diagnosis found.   Plan: Problem List Items Addressed This Visit    None      Prentice Docker, MD 07/27/2017 10:15 AM

## 2017-08-01 ENCOUNTER — Telehealth: Payer: Self-pay

## 2017-08-01 NOTE — Telephone Encounter (Signed)
Pt calling triage with questions about IUD. Tried to call pt back, please let me know when she returns my call

## 2017-08-24 ENCOUNTER — Ambulatory Visit: Payer: BC Managed Care – PPO | Admitting: Obstetrics and Gynecology

## 2017-08-27 ENCOUNTER — Ambulatory Visit: Payer: BC Managed Care – PPO | Admitting: Obstetrics and Gynecology

## 2017-08-27 ENCOUNTER — Encounter: Payer: Self-pay | Admitting: Obstetrics and Gynecology

## 2017-08-27 VITALS — BP 122/78 | HR 65 | Ht 65.0 in | Wt 211.0 lb

## 2017-08-27 DIAGNOSIS — Z30431 Encounter for routine checking of intrauterine contraceptive device: Secondary | ICD-10-CM

## 2017-08-27 NOTE — Progress Notes (Signed)
   IUD String Check  Subjctive: Kirsten Cruz presents for IUD string check.  She had a Mirena placed 4 weeks ago.  Since placement of her IUD she had vaginal bleeding nearly the entire time since it was placed, though her bleeding stopped several days ago.  She admits to cramping or discomfort, especially initially, though she is doing much better now.  She has not had intercourse since placement.  She has not checked the strings.  She denies any fever, chills, nausea, vomiting, or other complaints.    Objective: BP 122/78 (BP Location: Left Arm, Patient Position: Sitting, Cuff Size: Large)   Pulse 65   Ht 5\' 5"  (1.651 m)   Wt 211 lb (95.7 kg)   LMP 08/09/2017   SpO2 99%   BMI 35.11 kg/m  Physical Exam  Constitutional: She is oriented to person, place, and time. She appears well-developed and well-nourished. No distress.  HENT:  Head: Normocephalic and atraumatic.  Nose: Nose normal.  Eyes: Conjunctivae are normal.  Neck: Normal range of motion.  Cardiovascular: Normal rate, regular rhythm and normal heart sounds.  Pulmonary/Chest: Effort normal and breath sounds normal.  Abdominal: Soft. She exhibits no distension. There is no tenderness. There is no rebound and no guarding.  Genitourinary: Uterus normal. Pelvic exam was performed with patient supine. There is no rash, tenderness or lesion on the right labia. There is no rash, tenderness or lesion on the left labia. Uterus is not tender. Cervix exhibits no motion tenderness and no discharge. Right adnexum displays no mass. Left adnexum displays no mass. No vaginal discharge found.  Genitourinary Comments: IUD strings present. Normal length. Not trimmed today.  Musculoskeletal: Normal range of motion.  Lymphadenopathy:       Right: No inguinal adenopathy present.       Left: No inguinal adenopathy present.  Neurological: She is alert and oriented to person, place, and time.  Skin: Skin is warm and dry. No rash noted.    Psychiatric: She has a normal mood and affect. Her behavior is normal. Judgment normal.    Female chaperone was present for the entirety of the pelvic exam  Assessment: 47 y.o. year old female status post prior Mirena IUD placement 4 week ago, doing well.  Plan: 1.  The patient was given instructions to check her IUD strings monthly and call with any problems or concerns.  She should call for fevers, chills, abnormal vaginal discharge, pelvic pain, or other complaints.  She has been discouraged by the initial experience with the IUD, though she is doing better today.  Encouraged to continue as long as she sees incremental improvement. She should expect some more bleeding, likely more cramping. However, overall, this should improve. 2.  She will return for a annual exam in 1 year.  All questions answered.  15 minutes spent in face to face discussion with > 50% spent in counseling, management, and coordination of care for her newly-placed IUD.  Risks and benefits of IUD discussed including the risks of irregular bleeding, cramping, infection, malpositioning, expulsion, which may require further procedures such as laparoscopy.  IUDs while effective at preventing pregnancy do not prevent transmission of sexually transmitted diseases and use of barrier methods for this purpose was discussed.  Low overall incidence of failure with 99.7% efficacy rate in typical use.    Prentice Docker, MD 08/27/2017 9:30 AM

## 2017-10-15 ENCOUNTER — Ambulatory Visit (INDEPENDENT_AMBULATORY_CARE_PROVIDER_SITE_OTHER): Payer: BC Managed Care – PPO | Admitting: Vascular Surgery

## 2017-10-15 ENCOUNTER — Encounter (INDEPENDENT_AMBULATORY_CARE_PROVIDER_SITE_OTHER): Payer: BC Managed Care – PPO

## 2017-12-20 ENCOUNTER — Ambulatory Visit (INDEPENDENT_AMBULATORY_CARE_PROVIDER_SITE_OTHER): Payer: BC Managed Care – PPO | Admitting: Vascular Surgery

## 2017-12-20 ENCOUNTER — Encounter (INDEPENDENT_AMBULATORY_CARE_PROVIDER_SITE_OTHER): Payer: BC Managed Care – PPO

## 2018-02-04 ENCOUNTER — Encounter (INDEPENDENT_AMBULATORY_CARE_PROVIDER_SITE_OTHER): Payer: BC Managed Care – PPO

## 2018-02-04 ENCOUNTER — Ambulatory Visit (INDEPENDENT_AMBULATORY_CARE_PROVIDER_SITE_OTHER): Payer: BC Managed Care – PPO | Admitting: Vascular Surgery

## 2018-02-05 ENCOUNTER — Other Ambulatory Visit: Payer: Self-pay | Admitting: Family Medicine

## 2018-02-05 DIAGNOSIS — Z1231 Encounter for screening mammogram for malignant neoplasm of breast: Secondary | ICD-10-CM

## 2018-03-07 ENCOUNTER — Encounter (INDEPENDENT_AMBULATORY_CARE_PROVIDER_SITE_OTHER): Payer: BC Managed Care – PPO

## 2018-03-07 ENCOUNTER — Ambulatory Visit (INDEPENDENT_AMBULATORY_CARE_PROVIDER_SITE_OTHER): Payer: BC Managed Care – PPO | Admitting: Vascular Surgery

## 2018-03-13 ENCOUNTER — Encounter: Payer: Self-pay | Admitting: Radiology

## 2018-03-13 ENCOUNTER — Ambulatory Visit
Admission: RE | Admit: 2018-03-13 | Discharge: 2018-03-13 | Disposition: A | Discharge status: Home / Self Care | Payer: BC Managed Care – PPO | Source: Ambulatory Visit | Attending: Family Medicine | Admitting: Family Medicine

## 2018-03-13 DIAGNOSIS — Z1231 Encounter for screening mammogram for malignant neoplasm of breast: Secondary | ICD-10-CM | POA: Insufficient documentation

## 2018-04-11 ENCOUNTER — Ambulatory Visit (INDEPENDENT_AMBULATORY_CARE_PROVIDER_SITE_OTHER): Payer: BC Managed Care – PPO | Admitting: Vascular Surgery

## 2018-04-11 ENCOUNTER — Encounter (INDEPENDENT_AMBULATORY_CARE_PROVIDER_SITE_OTHER): Payer: BC Managed Care – PPO

## 2018-04-18 ENCOUNTER — Encounter (INDEPENDENT_AMBULATORY_CARE_PROVIDER_SITE_OTHER): Payer: Self-pay | Admitting: Nurse Practitioner

## 2018-04-18 ENCOUNTER — Ambulatory Visit (INDEPENDENT_AMBULATORY_CARE_PROVIDER_SITE_OTHER): Payer: BC Managed Care – PPO

## 2018-04-18 ENCOUNTER — Ambulatory Visit (INDEPENDENT_AMBULATORY_CARE_PROVIDER_SITE_OTHER): Payer: BC Managed Care – PPO | Admitting: Nurse Practitioner

## 2018-04-18 VITALS — BP 121/80 | HR 61 | Resp 16 | Ht 65.0 in | Wt 199.8 lb

## 2018-04-18 DIAGNOSIS — E785 Hyperlipidemia, unspecified: Secondary | ICD-10-CM | POA: Diagnosis not present

## 2018-04-18 DIAGNOSIS — R609 Edema, unspecified: Secondary | ICD-10-CM | POA: Diagnosis not present

## 2018-04-18 DIAGNOSIS — I890 Lymphedema, not elsewhere classified: Secondary | ICD-10-CM

## 2018-04-29 ENCOUNTER — Encounter (INDEPENDENT_AMBULATORY_CARE_PROVIDER_SITE_OTHER): Payer: Self-pay | Admitting: Nurse Practitioner

## 2018-04-29 DIAGNOSIS — I890 Lymphedema, not elsewhere classified: Secondary | ICD-10-CM | POA: Insufficient documentation

## 2018-04-29 NOTE — Progress Notes (Signed)
SUBJECTIVE:  Patient ID: Kirsten Cruz, female    DOB: 1971/01/30, 48 y.o.   MRN: [id number] Chief Complaint  Patient presents with  . Follow-up    HPI  Kirsten Cruz is a 48 y.o. female The patient returns to the office for followup evaluation regarding leg swelling.  The swelling has persisted and the pain associated with swelling continues. There have not been any interval development of a ulcerations or wounds.  Since the previous visit the patient has been wearing graduated compression stockings and has noted little if any improvement in the lymphedema. The patient has been using compression routinely morning until night.  The patient also states elevation during the day and exercise is being done too.  The patient underwent a lower extremity venous reflux study which revealed reflux in the right common femoral vein.  There was no evidence of DVT in the bilateral lower extremities.  There was also no evidence of superficial venous thrombosis.  There was no chronic venous insufficiency in the left lower extremity.  Past Medical History:  Diagnosis Date  . Anemia   . Chronic kidney disease   . Heart murmur    many years ago and her cardiologist Dr. Clayborn Cruz is managing this  . Hyperlipidemia   . Hypertension   . Irregular heart beat   . Thyroid disease     Past Surgical History:  Procedure Laterality Date  . CESAREAN SECTION  02/11/1997  . CHOLECYSTECTOMY  2016  . TOE SURGERY Left 1999    Social History   Socioeconomic History  . Marital status: Single    Spouse name: Not on file  . Number of children: Not on file  . Years of education: Not on file  . Highest education level: Not on file  Occupational History  . Not on file  Social Needs  . Financial resource strain: Not on file  . Food insecurity:    Worry: Not on file    Inability: Not on file  . Transportation needs:    Medical: Not on file    Non-medical: Not on file  Tobacco Use  . Smoking status:  Never Smoker  . Smokeless tobacco: Never Used  Substance and Sexual Activity  . Alcohol use: No  . Drug use: Never  . Sexual activity: Not Currently    Birth control/protection: Condom  Lifestyle  . Physical activity:    Days per week: Not on file    Minutes per session: Not on file  . Stress: Not on file  Relationships  . Social connections:    Talks on phone: Not on file    Gets together: Not on file    Attends religious service: Not on file    Active member of club or organization: Not on file    Attends meetings of clubs or organizations: Not on file    Relationship status: Not on file  . Intimate partner violence:    Fear of current or ex partner: Not on file    Emotionally abused: Not on file    Physically abused: Not on file    Forced sexual activity: Not on file  Other Topics Concern  . Not on file  Social History Narrative  . Not on file    Family History  Problem Relation Age of Onset  . Breast cancer Mother 74  . Cancer Mother   . Diabetes Mother   . Varicose Veins Mother   . Cancer Father   . Diabetes Sister  Allergies  Allergen Reactions  . Buspirone Nausea Only    Other reaction(s): Headache  . Pravastatin Other (See Comments)    Pt believes she had a seizure with it.  . Morphine Rash  . Morphine And Related Rash     Review of Systems   Review of Systems: Negative Unless Checked Constitutional: [] Weight loss  [] Fever  [] Chills Cardiac: [] Chest pain   []  Atrial Fibrillation  [] Palpitations   [] Shortness of breath when laying flat   [] Shortness of breath with exertion. [] Shortness of breath at rest Vascular:  [] Pain in legs with walking   [] Pain in legs with standing [] Pain in legs when laying flat   [] Claudication    [] Pain in feet when laying flat    [] History of DVT   [] Phlebitis   [x] Swelling in legs   [x] Varicose veins   [] Non-healing ulcers Pulmonary:   [] Uses home oxygen   [] Productive cough   [] Hemoptysis   [] Wheeze  [] COPD    [] Asthma Neurologic:  [] Dizziness   [] Seizures  [] Blackouts [] History of stroke   [] History of TIA  [] Aphasia   [] Temporary Blindness   [] Weakness or numbness in arm   [] Weakness or numbness in leg Musculoskeletal:   [] Joint swelling   [] Joint pain   [] Low back pain  []  History of Knee Replacement [] Arthritis [] back Surgeries  []  Spinal Stenosis    Hematologic:  [] Easy bruising  [] Easy bleeding   [] Hypercoagulable state   [] Anemic Gastrointestinal:  [] Diarrhea   [] Vomiting  [] Gastroesophageal reflux/heartburn   [] Difficulty swallowing. [] Abdominal pain Genitourinary:  [x] Chronic kidney disease   [] Difficult urination  [] Anuric   [] Blood in urine [] Frequent urination  [] Burning with urination   [] Hematuria Skin:  [] Rashes   [] Ulcers [] Wounds Psychological:  [] History of anxiety   []  History of major depression  []  Memory Difficulties      OBJECTIVE:   Physical Exam  BP 121/80 (BP Location: Right Arm, Patient Position: Sitting)   Pulse 61   Resp 16   Ht 5\' 5"  (1.651 m)   Wt 199 lb 12.8 oz (90.6 kg)   BMI 33.25 kg/m   Gen: WD/WN, NAD Head: [place]/AT, No temporalis wasting.  Ear/Nose/Throat: Hearing grossly intact, nares w/o erythema or drainage Eyes: PER, EOMI, sclera nonicteric.  Neck: Supple, no masses.  No JVD.  Pulmonary:  Good air movement, no use of accessory muscles.  Cardiac: RRR Vascular: 2+ soft edema bilaterally Vessel Right Left  Radial Palpable Palpable  Dorsalis Pedis Palpable Palpable  Posterior Tibial Palpable Palpable   Gastrointestinal: soft, non-distended. No guarding/no peritoneal signs.  Musculoskeletal: M/S 5/5 throughout.  No deformity or atrophy.  Neurologic: Pain and light touch intact in extremities.  Symmetrical.  Speech is fluent. Motor exam as listed above. Psychiatric: Judgment intact, Mood & affect appropriate for pt's clinical situation. Dermatologic: No Venous rashes. No Ulcers Noted.  No changes consistent with cellulitis. Lymph : No Cervical  lymphadenopathy,dermal thickening bilaterally       ASSESSMENT AND PLAN:  1. Lymphedema Recommend:  No surgery or intervention at this point in time.    I have reviewed my previous discussion with the patient regarding swelling and why it causes symptoms.  Patient will continue wearing graduated compression stockings class 1 (20-30 mmHg) on a daily basis. The patient will  beginning wearing the stockings first thing in the morning and removing them in the evening. The patient is instructed specifically not to sleep in the stockings.    In addition, behavioral modification including several periods  of elevation of the lower extremities during the day will be continued.  This was reviewed with the patient during the initial visit.  The patient will also continue routine exercise, especially walking on a daily basis as was discussed during the initial visit.    Despite conservative treatments including graduated compression therapy class 1 and behavioral modification including exercise and elevation the patient  has not obtained adequate control of the lymphedema.  The patient still has stage 3 lymphedema and therefore, I believe that a lymph pump should be added to improve the control of the patient's lymphedema.  Additionally, a lymph pump is warranted because it will reduce the risk of cellulitis and ulceration in the future.  Patient should follow-up in six months    2. Hyperlipidemia, unspecified hyperlipidemia type Continue statin as ordered and reviewed, no changes at this time    Current Outpatient Medications on File Prior to Visit  Medication Sig Dispense Refill  . atorvastatin (LIPITOR) 40 MG tablet Take 40 mg by mouth daily.  3  . B Complex Vitamins (B-COMPLEX/B-12) TABS Per MD, to help with trending lower b12 levels    . clonazePAM (KLONOPIN) 0.5 MG tablet TAKE 1 TABLET BY MOUTH ONCE A DAY AS NEEDED FOR ANXIETY FOR UP TO 1 DOSE    . cyclobenzaprine (FLEXERIL) 10 MG tablet  cyclobenzaprine 10 mg tablet    . levonorgestrel (MIRENA) 20 MCG/24HR IUD 1 Intra Uterine Device (1 each total) by Intrauterine route once for 1 dose. 1 each 0  . LOMAIRA 8 MG TABS TK 1 T PO QD    . metroNIDAZOLE (METROGEL) 0.75 % vaginal gel metronidazole 0.75 % vaginal gel    . naproxen (NAPROSYN) 500 MG tablet Take 1 tablet (500 mg total) by mouth 2 (two) times daily with a meal. 20 tablet 2  . ondansetron (ZOFRAN-ODT) 4 MG disintegrating tablet DIS 1 TO 2 TS ON THE TONGUE TID PRF NAUSEA  0  . SUMAtriptan (IMITREX) 100 MG tablet Take by mouth.    . topiramate (TOPAMAX) 50 MG tablet Take by mouth.    . triamterene-hydrochlorothiazide (DYAZIDE) 37.5-25 MG capsule Take by mouth.    . valACYclovir (VALTREX) 500 MG tablet One po bid for flare x 3 days     No current facility-administered medications on file prior to visit.     There are no Patient Instructions on file for this visit. No follow-ups on file.   Kris Hartmann, NP  This note was completed with Sales executive.  Any errors are purely unintentional.

## 2018-05-01 ENCOUNTER — Ambulatory Visit: Payer: BC Managed Care – PPO | Admitting: Obstetrics and Gynecology

## 2018-05-01 ENCOUNTER — Encounter: Payer: Self-pay | Admitting: Obstetrics and Gynecology

## 2018-05-01 VITALS — BP 126/81 | HR 80 | Wt 200.0 lb

## 2018-05-01 DIAGNOSIS — Z30431 Encounter for routine checking of intrauterine contraceptive device: Secondary | ICD-10-CM | POA: Diagnosis not present

## 2018-05-01 DIAGNOSIS — T8332XA Displacement of intrauterine contraceptive device, initial encounter: Secondary | ICD-10-CM | POA: Diagnosis not present

## 2018-05-01 NOTE — Progress Notes (Signed)
   IUD String Check  Subjctive: [name] presents for IUD check.  She had a Mirena placed 8 months ago.  Since placement of her IUD she had some vaginal bleeding.  She states that she will sometimes she has a regular period and others she will check have spotting. One time she had a period for a whole month.  Her migraines are less.  She is mostly concerned about the fact that she has had recurrent BV since the placement of the IUD. She normally gets BV about 2-3 times per year. However, since the placement of the IUD, she has had BV 4-5 times.  Overall, she state her bleeding is better.  Her bleeding lasts less time, on average, than before.  She denies cramping or discomfort.  She has had intercourse since placement.  She has not checked checked the strings.  She denies any fever, chills, nausea, vomiting, or other complaints.  Her last pap smear was 12/26/2016 and it was normal with negative HPV.   Objective: BP 126/81 (BP Location: Left Arm, Patient Position: Sitting, Cuff Size: Normal)   Pulse 80   Wt 200 lb (90.7 kg)   BMI 33.28 kg/m  Physical Exam Exam conducted with a chaperone present.  Constitutional:      General: She is not in acute distress.    Appearance: Normal appearance.  HENT:     Head: Normocephalic and atraumatic.  Eyes:     General: No scleral icterus.    Conjunctiva/sclera: Conjunctivae normal.  Abdominal:     General: There is no distension.     Tenderness: There is no abdominal tenderness. There is no guarding or rebound.  Genitourinary:    General: Normal vulva.     Exam position: Supine.     Labia:        Right: No rash, tenderness or lesion.        Left: No rash, tenderness or lesion.      Vagina: Normal.     Cervix: Normal.     Uterus: Enlarged.      Adnexa:        Right: Fullness present.        Left: Fullness present.      Comments: IUD Strings not visualized Skin:    General: Skin is warm and dry.     Coloration: Skin is not jaundiced.   Neurological:     General: No focal deficit present.     Mental Status: She is alert and oriented to person, place, and time.     Cranial Nerves: No cranial nerve deficit.  Psychiatric:        Mood and Affect: Mood normal.        Behavior: Behavior normal.        Judgment: Judgment normal.    Female chaperone was present for the entirety of the pelvic exam  Assessment: 48 y.o. year old female status post prior Mirena IUD placement 8 months ago, strings not visualized today.. Intrauterine contraceptive device threads lost, initial encounter  Plan:  Immediate ultrasound to verify IUD in correct location.  It appears her biggest concern is her most recent period being heavier than normal.    15 minutes spent in face to face discussion with > 50% spent in counseling, management, and coordination of care for her IUD with lost strings.   Prentice Docker, MD 05/01/2018 11:09 AM

## 2018-05-10 ENCOUNTER — Ambulatory Visit (INDEPENDENT_AMBULATORY_CARE_PROVIDER_SITE_OTHER): Payer: BC Managed Care – PPO

## 2018-05-10 ENCOUNTER — Ambulatory Visit (INDEPENDENT_AMBULATORY_CARE_PROVIDER_SITE_OTHER): Payer: BC Managed Care – PPO | Admitting: Obstetrics and Gynecology

## 2018-05-10 ENCOUNTER — Encounter: Payer: Self-pay | Admitting: Obstetrics and Gynecology

## 2018-05-10 VITALS — BP 100/60 | HR 98 | Ht 65.0 in | Wt 200.0 lb

## 2018-05-10 DIAGNOSIS — T8332XD Displacement of intrauterine contraceptive device, subsequent encounter: Secondary | ICD-10-CM

## 2018-05-10 DIAGNOSIS — D251 Intramural leiomyoma of uterus: Secondary | ICD-10-CM

## 2018-05-10 DIAGNOSIS — D252 Subserosal leiomyoma of uterus: Secondary | ICD-10-CM

## 2018-05-10 DIAGNOSIS — N83291 Other ovarian cyst, right side: Secondary | ICD-10-CM

## 2018-05-10 DIAGNOSIS — T8332XA Displacement of intrauterine contraceptive device, initial encounter: Secondary | ICD-10-CM

## 2018-05-10 NOTE — Progress Notes (Signed)
Gynecology Ultrasound Follow Up   Chief Complaint  Patient presents with  . Follow-up    U/S IUD Location   History of Present Illness: Patient is a 48 y.o. female who presents today for ultrasound evaluation of intrauterine device with lost threads.   Ultrasound demonstrates the following findings Adnexa: right ovary with 3.5 x 3.0 x 2.6 cm (para-ovarian cyst), otherwise normal Uterus: anteverted with endometrial stripe  7.0 mm Additional: two fibroids noted (1st IM 2.3 x 1.9 x 1.8 cm, 2nd SS 1.5 x 1.3 x 1.5 cm) IUD appears to be in correct location  Past Medical History:  Diagnosis Date  . Anemia   . Chronic kidney disease   . Heart murmur    many years ago and her cardiologist Dr. Clayborn Bigness is managing this  . Hyperlipidemia   . Hypertension   . Irregular heart beat   . Thyroid disease     Past Surgical History:  Procedure Laterality Date  . CESAREAN SECTION  02/11/1997  . CHOLECYSTECTOMY  2016  . TOE SURGERY Left 1999    Family History  Problem Relation Age of Onset  . Breast cancer Mother 39  . Cancer Mother   . Diabetes Mother   . Varicose Veins Mother   . Cancer Father   . Diabetes Sister     Social History   Socioeconomic History  . Marital status: Single    Spouse name: Not on file  . Number of children: Not on file  . Years of education: Not on file  . Highest education level: Not on file  Occupational History  . Not on file  Social Needs  . Financial resource strain: Not on file  . Food insecurity:    Worry: Not on file    Inability: Not on file  . Transportation needs:    Medical: Not on file    Non-medical: Not on file  Tobacco Use  . Smoking status: Never Smoker  . Smokeless tobacco: Never Used  Substance and Sexual Activity  . Alcohol use: No  . Drug use: Never  . Sexual activity: Not Currently    Birth control/protection: Condom  Lifestyle  . Physical activity:    Days per week: Not on file    Minutes per session: Not on  file  . Stress: Not on file  Relationships  . Social connections:    Talks on phone: Not on file    Gets together: Not on file    Attends religious service: Not on file    Active member of club or organization: Not on file    Attends meetings of clubs or organizations: Not on file    Relationship status: Not on file  . Intimate partner violence:    Fear of current or ex partner: Not on file    Emotionally abused: Not on file    Physically abused: Not on file    Forced sexual activity: Not on file  Other Topics Concern  . Not on file  Social History Narrative  . Not on file    Allergies  Allergen Reactions  . Buspirone Nausea Only    Other reaction(s): Headache  . Pravastatin Other (See Comments)    Pt believes she had a seizure with it.  . Morphine Rash  . Morphine And Related Rash    Prior to Admission medications   Medication Sig Start Date End Date Taking? Authorizing Provider  atorvastatin (LIPITOR) 40 MG tablet Take 40 mg by mouth daily. 06/19/17  Yes [provider]  B Complex Vitamins (B-COMPLEX/B-12) TABS Per MD, to help with trending lower b12 levels 06/28/17  Yes [provider]  LOMAIRA 8 MG TABS TK 1 T PO QD 03/27/18  Yes [provider]  metroNIDAZOLE (METROGEL) 0.75 % vaginal gel metronidazole 0.75 % vaginal gel   Yes [provider]  ondansetron (ZOFRAN-ODT) 4 MG disintegrating tablet DIS 1 TO 2 TS ON THE TONGUE TID PRF NAUSEA 07/16/17  Yes [provider]  SUMAtriptan (IMITREX) 100 MG tablet Take by mouth. 04/14/14  Yes [provider]  valACYclovir (VALTREX) 500 MG tablet One po bid for flare x 3 days 04/06/17  Yes [provider]  clonazePAM (KLONOPIN) 0.5 MG tablet TAKE 1 TABLET BY MOUTH ONCE A DAY AS NEEDED FOR ANXIETY FOR UP TO 1 DOSE 11/16/16 04/18/18  [provider]  cyclobenzaprine (FLEXERIL) 10 MG tablet cyclobenzaprine 10 mg tablet 03/07/16   [provider]  levonorgestrel  (MIRENA) 20 MCG/24HR IUD 1 Intra Uterine Device (1 each total) by Intrauterine route once for 1 dose. 07/27/17 04/18/18  Will Bonnet, MD  naproxen (NAPROSYN) 500 MG tablet Take 1 tablet (500 mg total) by mouth 2 (two) times daily with a meal. Patient not taking: Reported on 05/10/2018 08/17/15   Lavonia Drafts, MD  topiramate (TOPAMAX) 50 MG tablet Take by mouth. 07/16/17 07/16/18  [provider]  triamterene-hydrochlorothiazide (DYAZIDE) 37.5-25 MG capsule Take by mouth. 12/26/16 04/18/18  [provider]    Physical Exam BP 100/60 (BP Location: Left Arm, Patient Position: Sitting, Cuff Size: Normal)   Pulse 98   Ht 5\' 5"  (1.651 m)   Wt 200 lb (90.7 kg)   BMI 33.28 kg/m    General: NAD HEENT: normocephalic, anicteric Pulmonary: No increased work of breathing Extremities: no edema, erythema, or tenderness Neurologic: Grossly intact, normal gait Psychiatric: mood appropriate, affect full  Imaging Results US Pelvis Transvanginal Non-ob (tv Only)  Result Date: 05/10/2018 Patient Name: Kirsten Cruz DOB: 03-24-1970 MRN: [id number] ULTRASOUND REPORT Location: [facility] Date of Service: 05/10/2018 Indications: IUD check Findings: The uterus is anteverted and measures 10.0 x 6.6 x 5.6cm. Echo texture is homogenous with evidence of focal masses. Within the uterus are multiple suspected fibroids measuring: Fibroid 1:  2.3 x 1.9 x 1.8cm (Right, Intramural) Fibroid 2:  1.5 x 1.3 x 1.5cm (Left, Subserosal) The Endometrium measures 7.0 mm. IUD in place. Right Ovary measures 4.1 x 4.2 x 2.6 cm with simple cyst measuring 3.5 x 3.0 x 2.6cm. Possible para-ovarian cyst medial to right ovary vs other. Left Ovary measures 2.7 x 1.6 x 1.4 cm. It is normal in appearance. Survey of the adnexa demonstrates no adnexal masses. There is no free fluid in the cul de sac. Impression: 1. IUD in place. 2. Two fibroids as described above. 3. Simple right ovarian cyst with possible para-ovarian cyst.  Vita Barley, RDMS RVT The ultrasound images and findings were reviewed by me and I agree with the above report. Prentice Docker, MD, [facility], [facility] 05/10/2018 10:48 AM       Assessment: 48 y.o. G3P1021  1. Intrauterine contraceptive device threads lost, subsequent encounter      Plan: Problem List Items Addressed This Visit    None    Visit Diagnoses    Intrauterine contraceptive device threads lost, subsequent encounter    -  Primary     IUD in place She will think about either removal of IUD vs  recurrent BV treatments and let me know.  20 minutes spent in face to face discussion with > 50% spent in counseling,management, and coordination of care of her intrauterine device with threads lost.   Prentice Docker, MD, [place], [place] 05/10/2018 2:49 PM

## 2018-10-21 ENCOUNTER — Ambulatory Visit (INDEPENDENT_AMBULATORY_CARE_PROVIDER_SITE_OTHER): Payer: BC Managed Care – PPO | Admitting: Vascular Surgery

## 2018-12-26 ENCOUNTER — Ambulatory Visit (INDEPENDENT_AMBULATORY_CARE_PROVIDER_SITE_OTHER): Payer: BC Managed Care – PPO | Admitting: Vascular Surgery

## 2019-03-10 ENCOUNTER — Ambulatory Visit (INDEPENDENT_AMBULATORY_CARE_PROVIDER_SITE_OTHER): Payer: BC Managed Care – PPO | Admitting: Vascular Surgery

## 2019-04-09 ENCOUNTER — Encounter: Payer: Self-pay | Admitting: Obstetrics and Gynecology

## 2019-04-09 ENCOUNTER — Ambulatory Visit: Payer: BC Managed Care – PPO | Admitting: Obstetrics and Gynecology

## 2019-04-09 ENCOUNTER — Other Ambulatory Visit: Payer: Self-pay

## 2019-04-09 VITALS — BP 117/71 | HR 80 | Ht 64.0 in | Wt 190.0 lb

## 2019-04-09 DIAGNOSIS — T8332XD Displacement of intrauterine contraceptive device, subsequent encounter: Secondary | ICD-10-CM | POA: Diagnosis not present

## 2019-04-09 NOTE — Progress Notes (Deleted)
Obstetrics & Gynecology Office Visit   Chief Complaint:  Chief Complaint  Patient presents with  . Follow-up    PCP could not find IUD string on 1/25    History of Present Illness: ***    Past Medical History:  Diagnosis Date  . Anemia   . Chronic kidney disease   . Heart murmur    many years ago and her cardiologist Dr. Clayborn Bigness is managing this  . Hyperlipidemia   . Hypertension   . Irregular heart beat   . Thyroid disease     Past Surgical History:  Procedure Laterality Date  . CESAREAN SECTION  02/11/1997  . CHOLECYSTECTOMY  2016  . Mirena   07/27/2017  . TOE SURGERY Left 1999    Gynecologic History: No LMP recorded. (Menstrual status: IUD).  Obstetric History: [id number]  Family History  Problem Relation Age of Onset  . Breast cancer Mother 28  . Cancer Mother   . Diabetes Mother   . Varicose Veins Mother   . Cancer Father   . Diabetes Sister     Social History   Socioeconomic History  . Marital status: Single    Spouse name: Not on file  . Number of children: Not on file  . Years of education: Not on file  . Highest education level: Not on file  Occupational History  . Not on file  Tobacco Use  . Smoking status: Never Smoker  . Smokeless tobacco: Never Used  Substance and Sexual Activity  . Alcohol use: No  . Drug use: Never  . Sexual activity: Yes    Birth control/protection: I.U.D.  Other Topics Concern  . Not on file  Social History Narrative  . Not on file   Social Determinants of Health   Financial Resource Strain:   . Difficulty of Paying Living Expenses: Not on file  Food Insecurity:   . Worried About Charity fundraiser in the Last Year: Not on file  . Ran Out of Food in the Last Year: Not on file  Transportation Needs:   . Lack of Transportation (Medical): Not on file  . Lack of Transportation (Non-Medical): Not on file  Physical Activity:   . Days of Exercise per Week: Not on file  . Minutes of Exercise per Session: Not  on file  Stress:   . Feeling of Stress : Not on file  Social Connections:   . Frequency of Communication with Friends and Family: Not on file  . Frequency of Social Gatherings with Friends and Family: Not on file  . Attends Religious Services: Not on file  . Active Member of Clubs or Organizations: Not on file  . Attends Archivist Meetings: Not on file  . Marital Status: Not on file  Intimate Partner Violence:   . Fear of Current or Ex-Partner: Not on file  . Emotionally Abused: Not on file  . Physically Abused: Not on file  . Sexually Abused: Not on file    Allergies  Allergen Reactions  . Buspirone Nausea Only    Other reaction(s): Headache  . Pravastatin Other (See Comments)    Pt believes she had a seizure with it.  . Morphine Rash  . Morphine And Related Rash    Prior to Admission medications   Medication Sig Start Date End Date Taking? Authorizing Provider  atorvastatin (LIPITOR) 40 MG tablet Take 40 mg by mouth daily. 06/19/17  Yes [provider]  B Complex Vitamins (B-COMPLEX/B-12) TABS Per MD, to  help with trending lower b12 levels 06/28/17  Yes [provider]  cyclobenzaprine (FLEXERIL) 10 MG tablet cyclobenzaprine 10 mg tablet 03/07/16  Yes [provider]  LOMAIRA 8 MG TABS TK 1 T PO QD 03/27/18  Yes [provider]  metroNIDAZOLE (METROGEL) 0.75 % vaginal gel metronidazole 0.75 % vaginal gel   Yes [provider]  naproxen (NAPROSYN) 500 MG tablet Take 1 tablet (500 mg total) by mouth 2 (two) times daily with a meal. 08/17/15  Yes Lavonia Drafts, MD  ondansetron (ZOFRAN-ODT) 4 MG disintegrating tablet DIS 1 TO 2 TS ON THE TONGUE TID PRF NAUSEA 07/16/17  Yes [provider]  SUMAtriptan (IMITREX) 100 MG tablet Take by mouth. 04/14/14  Yes [provider]  valACYclovir (VALTREX) 500 MG tablet One po bid for flare x 3 days 04/06/17  Yes [provider]  clonazePAM (KLONOPIN) 0.5 MG tablet TAKE 1  TABLET BY MOUTH ONCE A DAY AS NEEDED FOR ANXIETY FOR UP TO 1 DOSE 11/16/16 04/18/18  [provider]  levonorgestrel (MIRENA) 20 MCG/24HR IUD 1 Intra Uterine Device (1 each total) by Intrauterine route once for 1 dose. 07/27/17 04/18/18  Will Bonnet, MD  topiramate (TOPAMAX) 50 MG tablet Take by mouth. 07/16/17 07/16/18  [provider]  triamterene-hydrochlorothiazide (DYAZIDE) 37.5-25 MG capsule Take by mouth. 12/26/16 04/18/18  [provider]    ROS   Physical Exam BP 117/71 (BP Location: Left Arm, Patient Position: Sitting, Cuff Size: Normal)   Pulse 80   Ht 5\' 4"  (1.626 m)   Wt 190 lb (86.2 kg)   BMI 32.61 kg/m  No LMP recorded. (Menstrual status: IUD). OBGyn Exam  Female chaperone present for pelvic and breast  portions of the physical exam  Assessment: 49 y.o. [id number] female here for No diagnosis found.   Plan: Problem List Items Addressed This Visit    None      Prentice Docker, MD 04/09/2019 8:55 AM

## 2019-04-09 NOTE — Progress Notes (Signed)
Obstetrics & Gynecology Office Visit   Chief Complaint  Patient presents with  . Follow-up    PCP could not find IUD string on 1/25    History of Present Illness: 49 y.o. [id number] female who presents with lost IUD strings. She states she went to her PCP and her PCP did not see the IUD strings. She had a similar issue about a year ago and she had a pelvic ultrasound 05/2018 and the IUD was in the correct location.  She has not specifically noted that the IUD has come out.  Since she has had the IUD she has had less in terms of bleeding.  She does state that it seems like it is more like having normal period.    Past Medical History:  Diagnosis Date  . Anemia   . Chronic kidney disease   . Heart murmur    many years ago and her cardiologist Dr. Clayborn Bigness is managing this  . Hyperlipidemia   . Hypertension   . Irregular heart beat   . Thyroid disease     Past Surgical History:  Procedure Laterality Date  . CESAREAN SECTION  02/11/1997  . CHOLECYSTECTOMY  2016  . Mirena   07/27/2017  . TOE SURGERY Left 1999    Gynecologic History: No LMP recorded. (Menstrual status: IUD).  Obstetric History: [id number]  Family History  Problem Relation Age of Onset  . Breast cancer Mother 44  . Cancer Mother   . Diabetes Mother   . Varicose Veins Mother   . Cancer Father   . Diabetes Sister     Social History   Socioeconomic History  . Marital status: Single    Spouse name: Not on file  . Number of children: Not on file  . Years of education: Not on file  . Highest education level: Not on file  Occupational History  . Not on file  Tobacco Use  . Smoking status: Never Smoker  . Smokeless tobacco: Never Used  Substance and Sexual Activity  . Alcohol use: No  . Drug use: Never  . Sexual activity: Yes    Birth control/protection: I.U.D.  Other Topics Concern  . Not on file  Social History Narrative  . Not on file   Social Determinants of Health   Financial Resource Strain:    . Difficulty of Paying Living Expenses: Not on file  Food Insecurity:   . Worried About Charity fundraiser in the Last Year: Not on file  . Ran Out of Food in the Last Year: Not on file  Transportation Needs:   . Lack of Transportation (Medical): Not on file  . Lack of Transportation (Non-Medical): Not on file  Physical Activity:   . Days of Exercise per Week: Not on file  . Minutes of Exercise per Session: Not on file  Stress:   . Feeling of Stress : Not on file  Social Connections:   . Frequency of Communication with Friends and Family: Not on file  . Frequency of Social Gatherings with Friends and Family: Not on file  . Attends Religious Services: Not on file  . Active Member of Clubs or Organizations: Not on file  . Attends Archivist Meetings: Not on file  . Marital Status: Not on file  Intimate Partner Violence:   . Fear of Current or Ex-Partner: Not on file  . Emotionally Abused: Not on file  . Physically Abused: Not on file  . Sexually Abused: Not on file  Allergies  Allergen Reactions  . Buspirone Nausea Only    Other reaction(s): Headache  . Pravastatin Other (See Comments)    Pt believes she had a seizure with it.  . Morphine Rash  . Morphine And Related Rash    Prior to Admission medications   Medication Sig Start Date End Date Taking? Authorizing Provider  atorvastatin (LIPITOR) 40 MG tablet Take 40 mg by mouth daily. 06/19/17  Yes [provider]  B Complex Vitamins (B-COMPLEX/B-12) TABS Per MD, to help with trending lower b12 levels 06/28/17  Yes [provider]  cyclobenzaprine (FLEXERIL) 10 MG tablet cyclobenzaprine 10 mg tablet 03/07/16  Yes [provider]  LOMAIRA 8 MG TABS TK 1 T PO QD 03/27/18  Yes [provider]  metroNIDAZOLE (METROGEL) 0.75 % vaginal gel metronidazole 0.75 % vaginal gel   Yes [provider]  naproxen (NAPROSYN) 500 MG tablet Take 1 tablet (500 mg total) by mouth 2 (two) times  daily with a meal. 08/17/15  Yes Lavonia Drafts, MD  ondansetron (ZOFRAN-ODT) 4 MG disintegrating tablet DIS 1 TO 2 TS ON THE TONGUE TID PRF NAUSEA 07/16/17  Yes [provider]  SUMAtriptan (IMITREX) 100 MG tablet Take by mouth. 04/14/14  Yes [provider]  valACYclovir (VALTREX) 500 MG tablet One po bid for flare x 3 days 04/06/17  Yes [provider]  clonazePAM (KLONOPIN) 0.5 MG tablet TAKE 1 TABLET BY MOUTH ONCE A DAY AS NEEDED FOR ANXIETY FOR UP TO 1 DOSE 11/16/16 04/18/18  [provider]  levonorgestrel (MIRENA) 20 MCG/24HR IUD 1 Intra Uterine Device (1 each total) by Intrauterine route once for 1 dose. 07/27/17 04/18/18  Will Bonnet, MD  topiramate (TOPAMAX) 50 MG tablet Take by mouth. 07/16/17 07/16/18  [provider]  triamterene-hydrochlorothiazide (DYAZIDE) 37.5-25 MG capsule Take by mouth. 12/26/16 04/18/18  [provider]    Review of Systems  Constitutional: Negative.   HENT: Negative.   Eyes: Negative.   Respiratory: Negative.   Cardiovascular: Negative.   Gastrointestinal: Negative.   Genitourinary: Negative.   Musculoskeletal: Negative.   Skin: Negative.   Neurological: Negative.   Psychiatric/Behavioral: Negative.      Physical Exam BP 117/71 (BP Location: Left Arm, Patient Position: Sitting, Cuff Size: Normal)   Pulse 80   Ht 5\' 4"  (1.626 m)   Wt 190 lb (86.2 kg)   BMI 32.61 kg/m  No LMP recorded. (Menstrual status: IUD). Physical Exam Constitutional:      General: She is not in acute distress.    Appearance: Normal appearance.  HENT:     Head: Normocephalic and atraumatic.  Eyes:     General: No scleral icterus.    Conjunctiva/sclera: Conjunctivae normal.  Neurological:     General: No focal deficit present.     Mental Status: She is alert and oriented to person, place, and time.     Cranial Nerves: No cranial nerve deficit.  Psychiatric:        Mood and Affect: Mood normal.        Behavior:  Behavior normal.        Judgment: Judgment normal.    Bedside transabdominal ultrasound: IUD appears to be in uterus, but is not completely diagnostic.   Female chaperone present for pelvic and breast  portions of the physical exam  Assessment: 49 y.o. [id number] female here for  1. Intrauterine contraceptive device threads lost, subsequent encounter      Plan: Problem List Items Addressed This Visit  None    Visit Diagnoses    Intrauterine contraceptive device threads lost, subsequent encounter    -  Primary   Relevant Orders   US PELVIS TRANSVAGINAL NON-OB (TV ONLY)     Will obtain transvaginal pelvic ultrasound to assess.  15 minutes spent in face to face discussion with > 50% spent in counseling,management, and coordination of care of her intrauterine device, threads lost.   Prentice Docker, MD 04/09/2019 5:41 PM

## 2019-04-16 ENCOUNTER — Ambulatory Visit (INDEPENDENT_AMBULATORY_CARE_PROVIDER_SITE_OTHER): Payer: BC Managed Care – PPO

## 2019-04-16 ENCOUNTER — Ambulatory Visit (INDEPENDENT_AMBULATORY_CARE_PROVIDER_SITE_OTHER): Payer: BC Managed Care – PPO | Admitting: Obstetrics and Gynecology

## 2019-04-16 ENCOUNTER — Ambulatory Visit: Payer: BC Managed Care – PPO | Admitting: Obstetrics and Gynecology

## 2019-04-16 ENCOUNTER — Other Ambulatory Visit: Payer: Self-pay

## 2019-04-16 ENCOUNTER — Encounter: Payer: Self-pay | Admitting: Obstetrics and Gynecology

## 2019-04-16 ENCOUNTER — Other Ambulatory Visit: Payer: Self-pay | Admitting: Obstetrics and Gynecology

## 2019-04-16 VITALS — BP 124/78 | Wt 196.0 lb

## 2019-04-16 DIAGNOSIS — D252 Subserosal leiomyoma of uterus: Secondary | ICD-10-CM | POA: Diagnosis not present

## 2019-04-16 DIAGNOSIS — Z975 Presence of (intrauterine) contraceptive device: Secondary | ICD-10-CM

## 2019-04-16 DIAGNOSIS — Z30431 Encounter for routine checking of intrauterine contraceptive device: Secondary | ICD-10-CM

## 2019-04-16 DIAGNOSIS — T8332XD Displacement of intrauterine contraceptive device, subsequent encounter: Secondary | ICD-10-CM

## 2019-04-16 NOTE — Progress Notes (Signed)
Gynecology Ultrasound Follow Up  Chief Complaint: Ultrasound for IUD location follow up   History of Present Illness: Patient is a 49 y.o. female who presents today for ultrasound evaluation of the above.  Ultrasound demonstrates the following findings Adnexa: no masses seen  Uterus: anteverted with endometrial stripe  6.2 mm Additional: The IUD appears to be located in the correct location within the uterus  IUD placed on 07/27/2017  Past Medical History:  Diagnosis Date  . Anemia   . Chronic kidney disease   . Heart murmur    many years ago and her cardiologist Dr. Clayborn Bigness is managing this  . Hyperlipidemia   . Hypertension   . Irregular heart beat   . Thyroid disease     Past Surgical History:  Procedure Laterality Date  . CESAREAN SECTION  02/11/1997  . CHOLECYSTECTOMY  2016  . Mirena   07/27/2017  . TOE SURGERY Left 1999    Family History  Problem Relation Age of Onset  . Breast cancer Mother 77  . Cancer Mother   . Diabetes Mother   . Varicose Veins Mother   . Cancer Father   . Diabetes Sister     Social History   Socioeconomic History  . Marital status: Single    Spouse name: Not on file  . Number of children: Not on file  . Years of education: Not on file  . Highest education level: Not on file  Occupational History  . Not on file  Tobacco Use  . Smoking status: Never Smoker  . Smokeless tobacco: Never Used  Substance and Sexual Activity  . Alcohol use: No  . Drug use: Never  . Sexual activity: Yes    Birth control/protection: I.U.D.  Other Topics Concern  . Not on file  Social History Narrative  . Not on file   Social Determinants of Health   Financial Resource Strain:   . Difficulty of Paying Living Expenses: Not on file  Food Insecurity:   . Worried About Charity fundraiser in the Last Year: Not on file  . Ran Out of Food in the Last Year: Not on file  Transportation Needs:   . Lack of Transportation (Medical): Not on file  .  Lack of Transportation (Non-Medical): Not on file  Physical Activity:   . Days of Exercise per Week: Not on file  . Minutes of Exercise per Session: Not on file  Stress:   . Feeling of Stress : Not on file  Social Connections:   . Frequency of Communication with Friends and Family: Not on file  . Frequency of Social Gatherings with Friends and Family: Not on file  . Attends Religious Services: Not on file  . Active Member of Clubs or Organizations: Not on file  . Attends Archivist Meetings: Not on file  . Marital Status: Not on file  Intimate Partner Violence:   . Fear of Current or Ex-Partner: Not on file  . Emotionally Abused: Not on file  . Physically Abused: Not on file  . Sexually Abused: Not on file    Allergies  Allergen Reactions  . Buspirone Nausea Only    Other reaction(s): Headache  . Pravastatin Other (See Comments)    Pt believes she had a seizure with it.  . Morphine Rash  . Morphine And Related Rash    Prior to Admission medications   Medication Sig Start Date End Date Taking? Authorizing Provider  atorvastatin (LIPITOR) 40 MG tablet Take  40 mg by mouth daily. 06/19/17   [provider]  B Complex Vitamins (B-COMPLEX/B-12) TABS Per MD, to help with trending lower b12 levels 06/28/17   [provider]  clonazePAM (KLONOPIN) 0.5 MG tablet TAKE 1 TABLET BY MOUTH ONCE A DAY AS NEEDED FOR ANXIETY FOR UP TO 1 DOSE 11/16/16 04/18/18  [provider]  cyclobenzaprine (FLEXERIL) 10 MG tablet cyclobenzaprine 10 mg tablet 03/07/16   [provider]  levonorgestrel (MIRENA) 20 MCG/24HR IUD 1 Intra Uterine Device (1 each total) by Intrauterine route once for 1 dose. 07/27/17 04/18/18  Will Bonnet, MD  LOMAIRA 8 MG TABS TK 1 T PO QD 03/27/18   [provider]  metroNIDAZOLE (METROGEL) 0.75 % vaginal gel metronidazole 0.75 % vaginal gel    [provider]  naproxen (NAPROSYN) 500 MG tablet Take 1 tablet (500 mg  total) by mouth 2 (two) times daily with a meal. 08/17/15   Lavonia Drafts, MD  ondansetron (ZOFRAN-ODT) 4 MG disintegrating tablet DIS 1 TO 2 TS ON THE TONGUE TID PRF NAUSEA 07/16/17   [provider]  SUMAtriptan (IMITREX) 100 MG tablet Take by mouth. 04/14/14   [provider]  topiramate (TOPAMAX) 50 MG tablet Take by mouth. 07/16/17 07/16/18  [provider]  triamterene-hydrochlorothiazide (DYAZIDE) 37.5-25 MG capsule Take by mouth. 12/26/16 04/18/18  [provider]  valACYclovir (VALTREX) 500 MG tablet One po bid for flare x 3 days 04/06/17   [provider]    Physical Exam BP 124/78   Wt 196 lb (88.9 kg)   BMI 33.64 kg/m    General: NAD HEENT: normocephalic, anicteric Pulmonary: No increased work of breathing Extremities: no edema, erythema, or tenderness Neurologic: Grossly intact, normal gait Psychiatric: mood appropriate, affect full  Imaging Results US PELVIC COMPLETE WITH TRANSVAGINAL  Result Date: 04/16/2019 Patient Name: Tamantha Sedberry DOB: 26-Apr-1970 MRN: [id number] ULTRASOUND REPORT Location: [facility] Date of Service: 04/16/2019 Indications: IUD placement Findings: The uterus is anteverted and measures 10.1 x 6.4 x 6.3 cm. Echo texture is heterogenous with evidence of focal masses. Within the uterus are multiple suspected fibroids measuring: Fibroid 1: 28.7 x 19.9 x 23.5 mm subserosal posterior Fibroid 2: 13.2 x 15.6 x 18.0 mm subserosal left The Endometrium measures 6.2 mm. The IUD is correctly located within the uterus. Right Ovary measures 2.9 x 1.4 x 1.6 cm. It is normal in appearance. There is a follicle off the edge of the right ovary measuring 12.7 x 12.4 x 16.1 mm. Left Ovary is not visible. Survey of the adnexa demonstrates no adnexal masses. There is no free fluid in the cul de sac. Impression: 1. The IUD is correctly located within the uterus. 2. The uterus is heterogeneous with two fibroids seen. 3. Normal appearing right  ovary. 4. The left ovary is not visible. Gweneth Dimitri, RT The ultrasound images and findings were reviewed by me and I agree with the above report. Prentice Docker, MD, [facility], [facility] 04/16/2019 10:11 AM       Assessment: 49 y.o. G3P1021  1. IUD check up      Plan: Problem List Items Addressed This Visit    None    Visit Diagnoses    IUD check up    -  Primary     Patient reassured regarding her IUD location. Follow up for routine gynecologic check ups, or as needed.  Prentice Docker, MD, [facility], [facility] 04/16/2019 10:17  AM

## 2019-05-04 IMAGING — MG MM DIGITAL SCREENING BILAT W/ CAD
4 series · 4 of 4 positions shown · non-contrast
Comparison: Previous exam(s).

CLINICAL DATA: Screening.

EXAM:
DIGITAL SCREENING BILATERAL MAMMOGRAM WITH CAD

[R MLO]
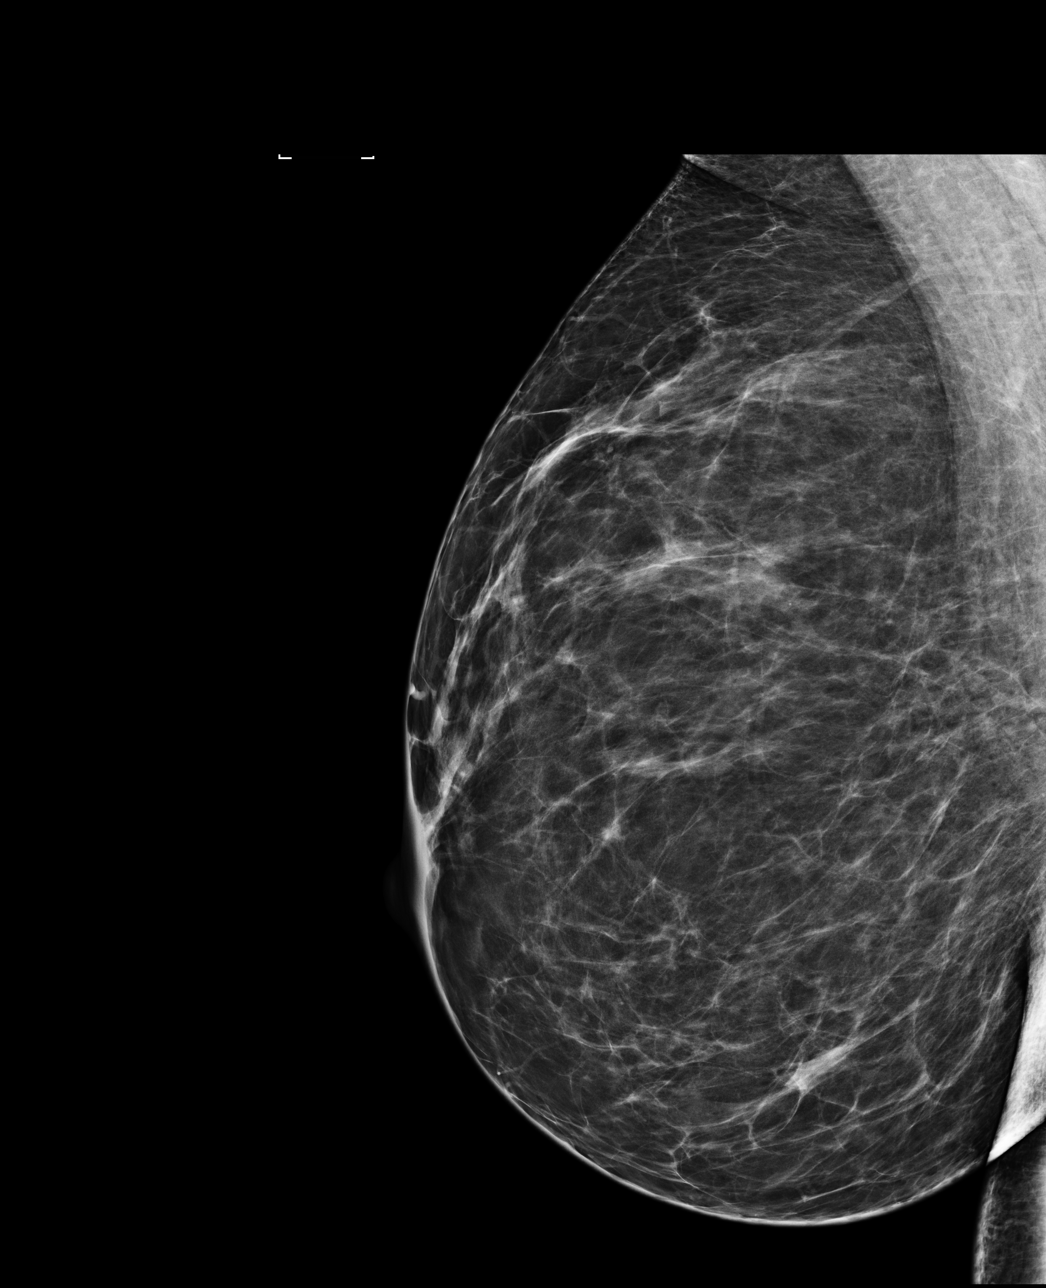

[L CC]
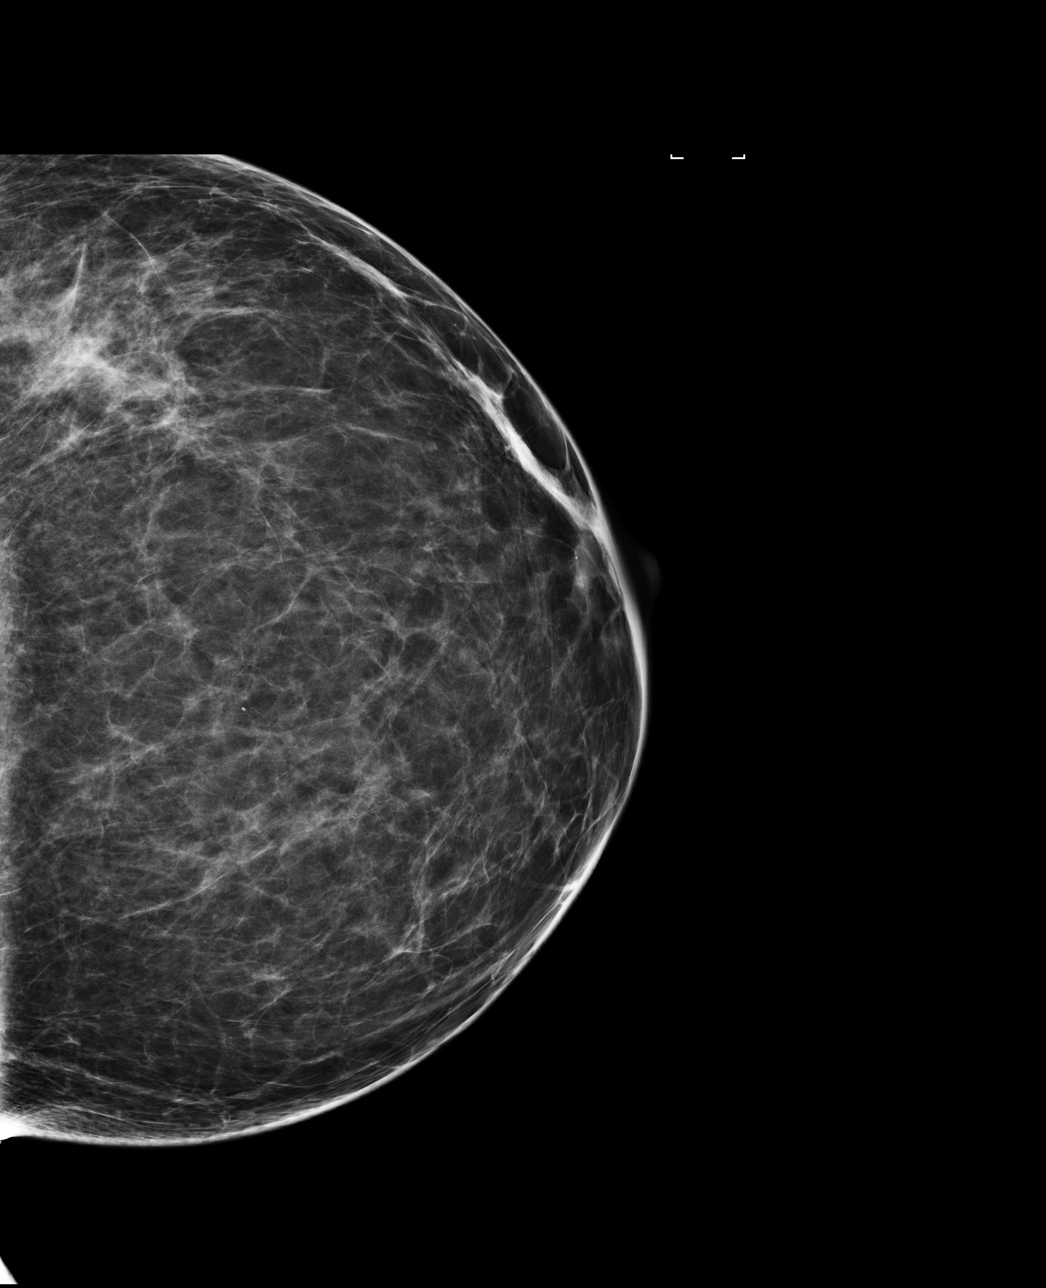

[R CC]
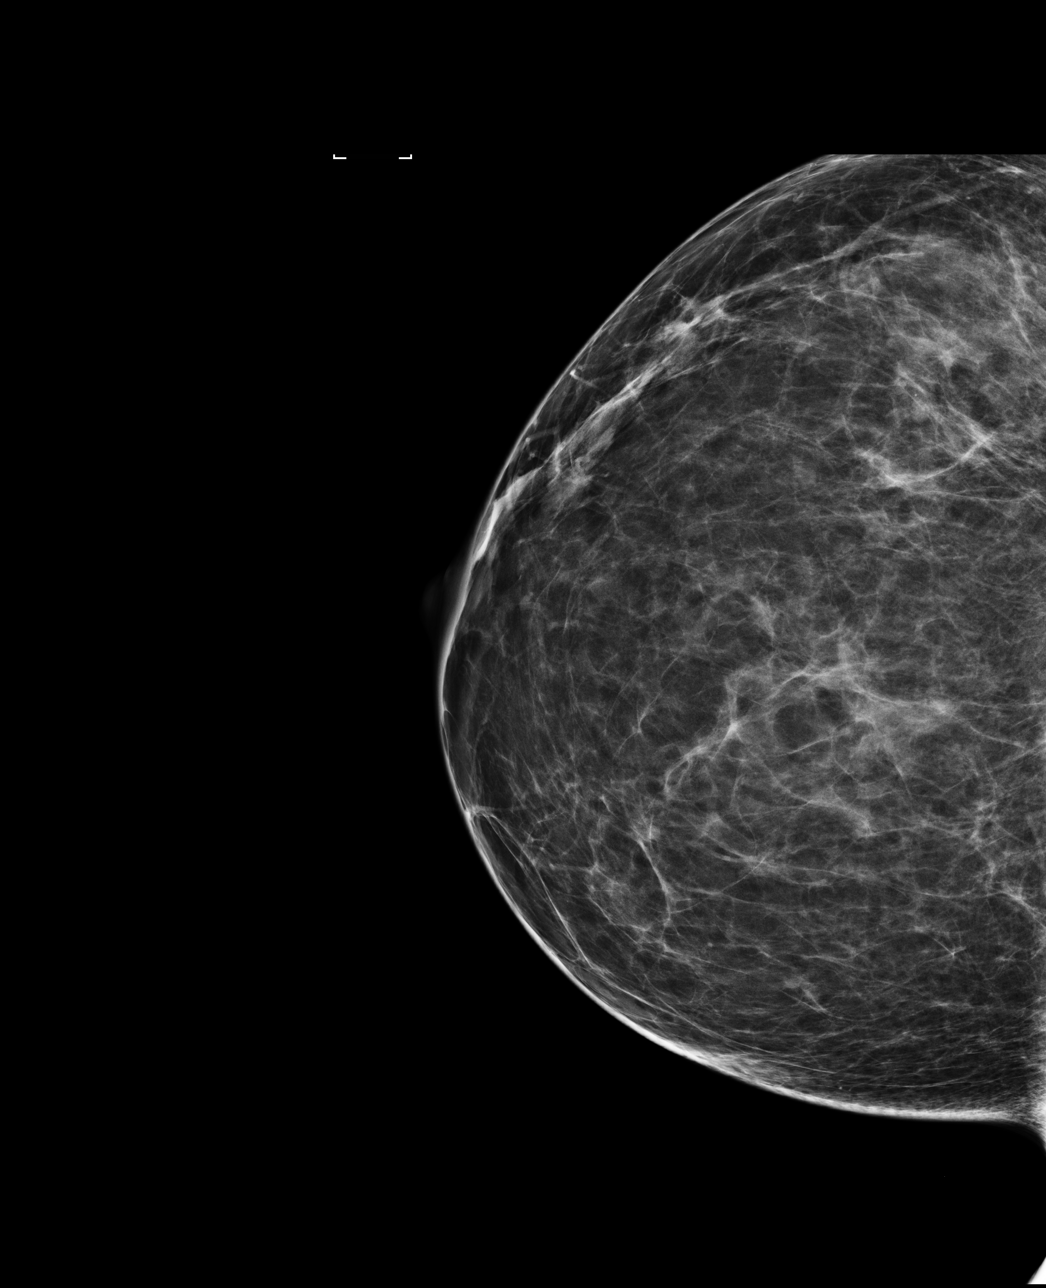

[L MLO]
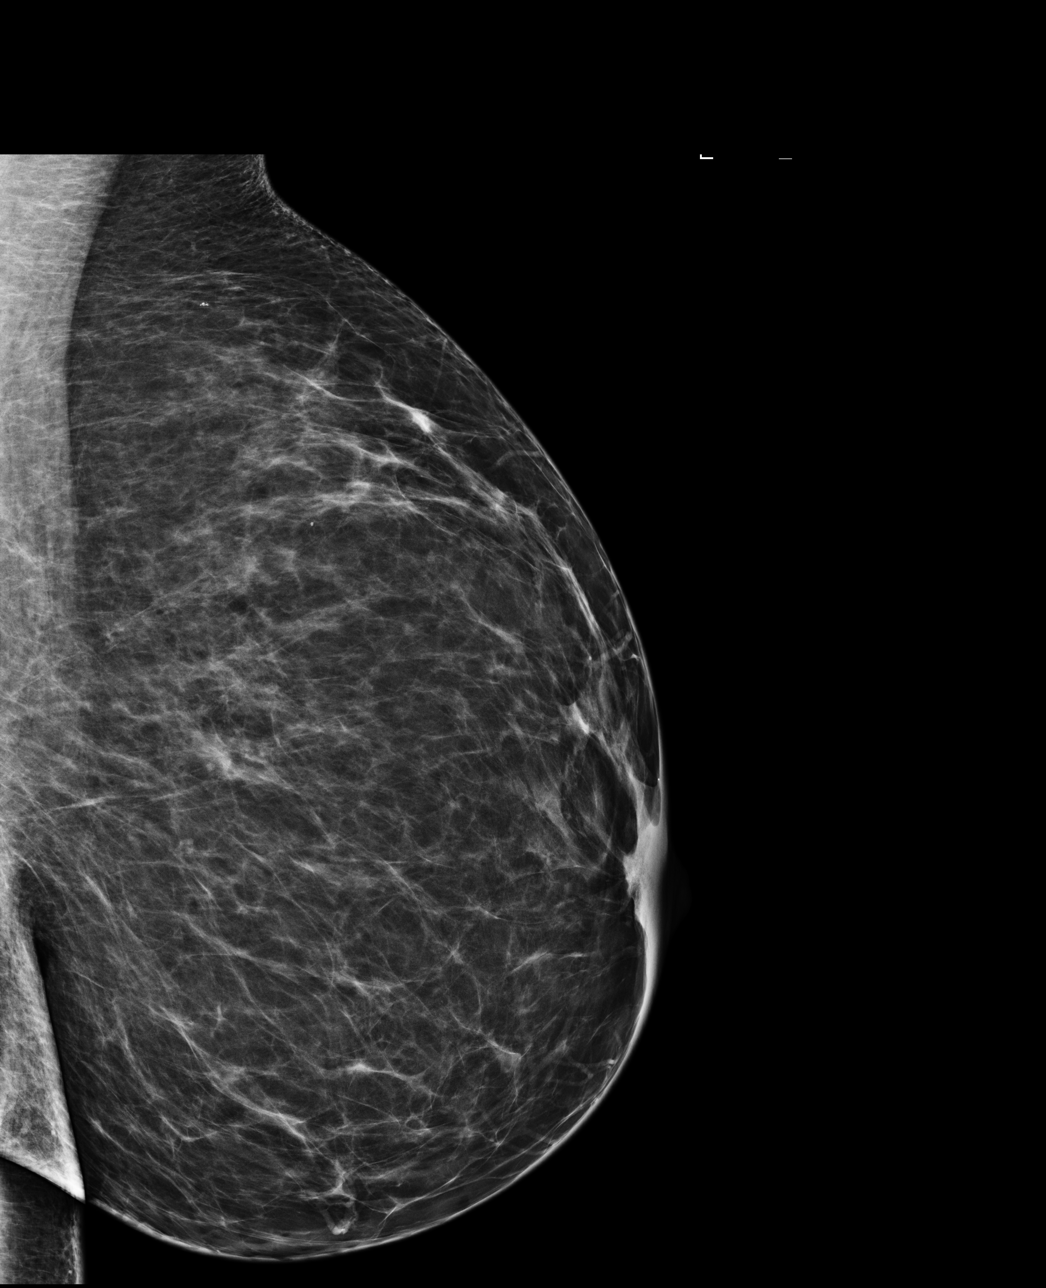

[4 of 4 positions shown; findings below may reference images not displayed]

ACR Breast Density Category c: The breast tissue is heterogeneously
dense, which may obscure small masses.
FINDINGS: There are no findings suspicious for malignancy. Images were
processed with CAD.
IMPRESSION: No mammographic evidence of malignancy. A result letter of this
screening mammogram will be mailed directly to the patient.

RECOMMENDATION:
Screening mammogram in one year. (Code:YJ-2-FEZ)

BI-RADS CATEGORY  1: Negative.

## 2019-06-19 ENCOUNTER — Other Ambulatory Visit: Payer: Self-pay | Admitting: Family Medicine

## 2019-06-19 DIAGNOSIS — Z1231 Encounter for screening mammogram for malignant neoplasm of breast: Secondary | ICD-10-CM

## 2019-07-14 ENCOUNTER — Ambulatory Visit
Admission: RE | Admit: 2019-07-14 | Discharge: 2019-07-14 | Disposition: A | Discharge status: Home / Self Care | Payer: BC Managed Care – PPO | Source: Ambulatory Visit | Attending: Family Medicine | Admitting: Family Medicine

## 2019-07-14 ENCOUNTER — Other Ambulatory Visit: Payer: Self-pay

## 2019-07-14 DIAGNOSIS — Z1231 Encounter for screening mammogram for malignant neoplasm of breast: Secondary | ICD-10-CM | POA: Insufficient documentation

## 2019-11-24 ENCOUNTER — Other Ambulatory Visit: Payer: Self-pay

## 2019-11-24 ENCOUNTER — Ambulatory Visit: Payer: Self-pay | Admitting: Family Medicine

## 2019-11-24 ENCOUNTER — Encounter: Payer: Self-pay | Admitting: Family Medicine

## 2019-11-24 DIAGNOSIS — Z113 Encounter for screening for infections with a predominantly sexual mode of transmission: Secondary | ICD-10-CM

## 2019-11-24 LAB — WET PREP FOR TRICH, YEAST, CLUE
Trichomonas Exam: NEGATIVE
Yeast Exam: NEGATIVE

## 2019-11-24 NOTE — Progress Notes (Signed)
[facility] Department STI clinic/screening visit  Subjective:  Kirsten Cruz is a 49 y.o. female being seen today for No chief complaint on file.    The patient reports they do not have symptoms. Patient reports that they do not desire a pregnancy in the next year. They reported they are not interested in discussing contraception today.   Patient has the following medical conditions:   Patient Active Problem List   Diagnosis Date Noted  . Lymphedema 04/29/2018  . Menorrhagia with regular cycle 07/27/2017  . Goiter 07/05/2017  . Migraines 07/05/2017  . Hyperlipidemia, unspecified 07/05/2017  . 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 CKD (chronic kidney disease) stage 3, GFR 30-59 ml/min 12/27/2016  . Pure hypercholesterolemia 12/27/2016  . Anxiety 08/03/2016  . Hepatic lesion 12/23/2015  . Hepatic steatosis 12/03/2014  . Edema, peripheral 04/14/2014  . Hashimoto's thyroiditis 10/27/2011    HPI  Pt reports she is here for STI screening, denies symptoms.   See flowsheet for further details and programmatic requirements.    No LMP recorded (lmp unknown). (Menstrual status: IUD). Last sex: 1 mo ago BCM: IUD Desires EC? n/a  Last pap per pt/review of record: NIL, HPV negative Last HIV test per pt/review of record: unsure, couple years  Last tetanus vaccine: 2020 Covid vaccine: completed  No components found for: HCV  The following portions of the patient's history were reviewed and updated as appropriate: allergies, current medications, past medical history, past social history, past surgical history and problem list.  Objective:  There were no vitals filed for this visit.   Physical Exam Vitals and nursing note reviewed.  Constitutional:      Appearance: Normal appearance.  HENT:     Head: Normocephalic and atraumatic.     Mouth/Throat:     Mouth: Mucous membranes are moist.     Pharynx: Oropharynx is clear. No oropharyngeal exudate  or posterior oropharyngeal erythema.  Pulmonary:     Effort: Pulmonary effort is normal.  Abdominal:     General: Abdomen is flat.     Palpations: There is no mass.     Tenderness: There is no abdominal tenderness. There is no rebound.  Genitourinary:    General: Normal vulva.     Exam position: Lithotomy position.     Pubic Area: No rash or pubic lice.      Labia:        Right: No rash or lesion.        Left: No rash or lesion.      Vagina: Vaginal discharge (minimal, white, ph<4.5) present. No erythema, bleeding or lesions.     Cervix: No cervical motion tenderness, discharge, friability, lesion or erythema.     Uterus: Normal.      Adnexa: Right adnexa normal and left adnexa normal.     Rectum: Normal.     Comments: No string visualized Lymphadenopathy:     Head:     Right side of head: No preauricular or posterior auricular adenopathy.     Left side of head: No preauricular or posterior auricular adenopathy.     Cervical: No cervical adenopathy.     Upper Body:     Right upper body: No supraclavicular or axillary adenopathy.     Left upper body: No supraclavicular or axillary adenopathy.     Lower Body: No right inguinal adenopathy. No left inguinal adenopathy.  Skin:    General: Skin is warm and dry.     Findings: No  rash.  Neurological:     Mental Status: She is alert and oriented to person, place, and time.      Assessment and Plan:  Kirsten Cruz is a 49 y.o. female presenting to the [facility] Department for STI screening    1. Screening examination for venereal disease -Pt without symptoms. Screenings today as below. Treat wet prep per standing order. -Patient does not meet criteria for HepB, HepC Screening.  -Counseled on warning s/sx and when to seek care. Recommended condom use with all sex and discussed importance of condom use for STI prevention. -Pt notified that not IUD strings were visualized - states she has had an [place] to assure IUD in  place.  - WET PREP FOR [place], [place], [place] [facility] - Syphilis Serology, [facility]    Return for screening as needed.  No future appointments.  Kandee Keen, PA-C

## 2019-11-24 NOTE — Progress Notes (Signed)
In for STD screen. Desires bloodwork. Denies symptoms. Barkley Boards, RN

## 2020-01-05 ENCOUNTER — Ambulatory Visit (INDEPENDENT_AMBULATORY_CARE_PROVIDER_SITE_OTHER): Payer: BC Managed Care – PPO | Admitting: Obstetrics and Gynecology

## 2020-01-05 ENCOUNTER — Telehealth: Payer: Self-pay

## 2020-01-05 ENCOUNTER — Other Ambulatory Visit: Payer: Self-pay

## 2020-01-05 ENCOUNTER — Encounter: Payer: Self-pay | Admitting: Obstetrics and Gynecology

## 2020-01-05 VITALS — BP 110/74 | Ht 65.0 in | Wt 202.0 lb

## 2020-01-05 DIAGNOSIS — N920 Excessive and frequent menstruation with regular cycle: Secondary | ICD-10-CM

## 2020-01-05 NOTE — Telephone Encounter (Signed)
Please have pt schedule an appt with SDJ. Thank you!

## 2020-01-05 NOTE — Telephone Encounter (Signed)
Pt calling; has had the Mirena for about 90yrs; has been bleeding for 2wks.  [phone]

## 2020-01-05 NOTE — Progress Notes (Signed)
Obstetrics & Gynecology Office Visit   Chief Complaint  Patient presents with  . Vaginal Bleeding  IUD check  History of Present Illness: 49 y.o. [id number] female who presents for follow up of abnormal bleeding. She had her current IUD placed in 2019.  This was placed due to abnormal bleeding and sensitivity to hormonal contraception with the side effect of headaches. She had a period starting on 10/25 last month that is tapering off now to just spotting. The week before 10/25 (around 10/17) she had spotting for a day or two and this just stopped.  Since her last period in April, she has not had a period. She denies chance of infection and STDs.  She denies abnormal discharge.   Her cramping is also severe and this is also a source of distress to her.  She has cramping today.     Past Medical History:  Diagnosis Date  . Anemia   . Chronic kidney disease   . Heart murmur    many years ago and her cardiologist Dr. Clayborn Bigness is managing this  . Hyperlipidemia   . Hypertension   . Irregular heart beat   . Thyroid disease     Past Surgical History:  Procedure Laterality Date  . CESAREAN SECTION  02/11/1997  . CHOLECYSTECTOMY  2016  . Mirena   07/27/2017  . TOE SURGERY Left 1999    Gynecologic History: No LMP recorded. (Menstrual status: IUD).  Obstetric History: [id number]  Family History  Problem Relation Age of Onset  . Breast cancer Mother 68  . Cancer Mother   . Diabetes Mother   . Varicose Veins Mother   . Cancer Father   . Diabetes Sister     Social History   Socioeconomic History  . Marital status: Single    Spouse name: Not on file  . Number of children: Not on file  . Years of education: Not on file  . Highest education level: Not on file  Occupational History  . Not on file  Tobacco Use  . Smoking status: Never Smoker  . Smokeless tobacco: Never Used  Vaping Use  . Vaping Use: Never used  Substance and Sexual Activity  . Alcohol use: No  . Drug use:  Never  . Sexual activity: Yes    Partners: Male    Birth control/protection: I.U.D.  Other Topics Concern  . Not on file  Social History Narrative  . Not on file   Social Determinants of Health   Financial Resource Strain:   . Difficulty of Paying Living Expenses: Not on file  Food Insecurity:   . Worried About Charity fundraiser in the Last Year: Not on file  . Ran Out of Food in the Last Year: Not on file  Transportation Needs:   . Lack of Transportation (Medical): Not on file  . Lack of Transportation (Non-Medical): Not on file  Physical Activity:   . Days of Exercise per Week: Not on file  . Minutes of Exercise per Session: Not on file  Stress:   . Feeling of Stress : Not on file  Social Connections:   . Frequency of Communication with Friends and Family: Not on file  . Frequency of Social Gatherings with Friends and Family: Not on file  . Attends Religious Services: Not on file  . Active Member of Clubs or Organizations: Not on file  . Attends Archivist Meetings: Not on file  . Marital Status: Not on file  Intimate Partner  Violence:   . Fear of Current or Ex-Partner: Not on file  . Emotionally Abused: Not on file  . Physically Abused: Not on file  . Sexually Abused: Not on file    Allergies  Allergen Reactions  . Buspirone Nausea Only    Other reaction(s): Headache  . Pravastatin Other (See Comments)    Pt believes she had a seizure with it.  . Morphine Rash  . Morphine And Related Rash    Prior to Admission medications   Medication Sig Start Date End Date Taking? Authorizing Provider  atorvastatin (LIPITOR) 40 MG tablet Take 40 mg by mouth daily. 06/19/17  Yes [provider]  B Complex Vitamins (B-COMPLEX/B-12) TABS Per MD, to help with trending lower b12 levels 06/28/17  Yes [provider]  naproxen (NAPROSYN) 500 MG tablet Take 1 tablet (500 mg total) by mouth 2 (two) times daily with a meal. 08/17/15  Yes Lavonia Drafts, MD    propranolol (INDERAL) 10 MG tablet Take by mouth. 10/09/18  Yes [provider]  valACYclovir (VALTREX) 500 MG tablet One po bid for flare x 3 days 04/06/17  Yes [provider]  clonazePAM (KLONOPIN) 0.5 MG tablet TAKE 1 TABLET BY MOUTH ONCE A DAY AS NEEDED FOR ANXIETY FOR UP TO 1 DOSE 11/16/16 04/18/18  [provider]  cyclobenzaprine (FLEXERIL) 10 MG tablet cyclobenzaprine 10 mg tablet Patient not taking: Reported on 01/05/2020 03/07/16   [provider]  levonorgestrel (MIRENA) 20 MCG/24HR IUD 1 Intra Uterine Device (1 each total) by Intrauterine route once for 1 dose. 07/27/17 04/18/18  Will Bonnet, MD  topiramate (TOPAMAX) 50 MG tablet Take by mouth. 07/16/17 07/16/18  [provider]  triamterene-hydrochlorothiazide (DYAZIDE) 37.5-25 MG capsule Take by mouth. 12/26/16 04/18/18  [provider]    Review of Systems  Constitutional: Negative.   HENT: Negative.   Eyes: Negative.   Respiratory: Negative.   Cardiovascular: Negative.   Gastrointestinal: Negative.   Genitourinary: Negative.        See HPI  Musculoskeletal: Negative.   Skin: Negative.   Neurological: Negative.   Psychiatric/Behavioral: Negative.      Physical Exam BP 110/74   Ht 5\' 5"  (1.651 m)   Wt 202 lb (91.6 kg)   BMI 33.61 kg/m  No LMP recorded. (Menstrual status: IUD). Physical Exam Constitutional:      General: She is not in acute distress.    Appearance: Normal appearance. She is well-developed.  Genitourinary:     Pelvic exam was performed with patient in the lithotomy position.     Vulva, inguinal canal, urethra, bladder, vagina, uterus, right adnexa and left adnexa normal.     No posterior fourchette tenderness, injury or lesion present.     No cervical friability, lesion, bleeding or polyp.     No IUD strings visualized (unchanged from prior).  HENT:     Head: Normocephalic and atraumatic.  Eyes:     General: No scleral icterus.     Conjunctiva/sclera: Conjunctivae normal.  Cardiovascular:     Rate and Rhythm: Normal rate and regular rhythm.     Heart sounds: No murmur heard.  No friction rub. No gallop.   Pulmonary:     Effort: Pulmonary effort is normal. No respiratory distress.     Breath sounds: Normal breath sounds. No wheezing or rales.  Abdominal:     General: Bowel sounds are normal. There is no distension.     Palpations: Abdomen is soft. There is no  mass.     Tenderness: There is no abdominal tenderness. There is no guarding or rebound.  Musculoskeletal:        General: Normal range of motion.     Cervical back: Normal range of motion and neck supple.  Neurological:     General: No focal deficit present.     Mental Status: She is alert and oriented to person, place, and time.     Cranial Nerves: No cranial nerve deficit.  Skin:    General: Skin is warm and dry.     Findings: No erythema.  Psychiatric:        Mood and Affect: Mood normal.        Behavior: Behavior normal.        Judgment: Judgment normal.    Female chaperone present for pelvic and breast  portions of the physical exam  Bedside transabdominal ultrasound: IUD appears to be in the correct location.  Assessment: 49 y.o. [id number] female here for  1. Menorrhagia with regular cycle      Plan: Problem List Items Addressed This Visit      Other   Menorrhagia with regular cycle - Primary (Chronic)     Discussed treatment options moving forward. We could remove the IUD and do nothing. However, she was having significant symptoms prior to the placement of the IUD.  Her symptoms seem (overall) to be much improved with the IUD with periods coming much less frequently. We could leave the IUD in place and see how her next few cycles are.  If they are absent, then she might consider keeping the IUD.  We discussed alternatives to medication.  We discuss endometrial ablation and hysterectomy. She would like to consider her options and will let me  know.  A total of 30 minutes were spent face-to-face with the patient as well as preparation, review, communication, and documentation during this encounter.    Prentice Docker, MD 01/05/2020 5:56 PM

## 2020-01-05 NOTE — Telephone Encounter (Signed)
Patient is scheduled for 3:30 with SDJ for 01/05/20

## 2020-01-05 NOTE — Patient Instructions (Signed)
Endometrial Ablation  Endometrial ablation is a procedure that destroys the thin inner layer of the lining of the uterus (endometrium). This procedure may be done:  To stop heavy periods.  To stop bleeding that is causing anemia.  To control irregular bleeding.  To treat bleeding caused by small tumors (fibroids) in the endometrium.  This procedure is often an alternative to major surgery, such as removal of the uterus and cervix (hysterectomy). As a result of this procedure:  You may not be able to have children. However, if you are premenopausal (you have not gone through menopause):  You may still have a small chance of getting pregnant.  You will need to use a reliable method of birth control after the procedure to prevent pregnancy.  You may stop having a menstrual period, or you may have only a small amount of bleeding during your period. Menstruation may return several years after the procedure.  Tell a health care provider about:  Any allergies you have.  All medicines you are taking, including vitamins, herbs, eye drops, creams, and over-the-counter medicines.  Any problems you or family members have had with the use of anesthetic medicines.  Any blood disorders you have.  Any surgeries you have had.  Any medical conditions you have.  What are the risks?  Generally, this is a safe procedure. However, problems may occur, including:  A hole (perforation) in the uterus or bowel.  Infection of the uterus, bladder, or vagina.  Bleeding.  Damage to other structures or organs.  An air bubble in the lung (air embolus).  Problems with pregnancy after the procedure.  Failure of the procedure.  Decreased ability to diagnose cancer in the endometrium.  What happens before the procedure?  You will have tests of your endometrium to make sure there are no pre-cancerous cells or cancer cells present.  You may have an ultrasound of the uterus.  You may be given medicines to thin the endometrium.  Ask your health care  provider about:  Changing or stopping your regular medicines. This is especially important if you take diabetes medicines or blood thinners.  Taking medicines such as aspirin and ibuprofen. These medicines can thin your blood. Do not take these medicines before your procedure if your doctor tells you not to.  Plan to have someone take you home from the hospital or clinic.  What happens during the procedure?    You will lie on an exam table with your feet and legs supported as in a pelvic exam.  To lower your risk of infection:  Your health care team will wash or sanitize their hands and put on germ-free (sterile) gloves.  Your genital area will be washed with soap.  An IV tube will be inserted into one of your veins.  You will be given a medicine to help you relax (sedative).  A surgical instrument with a light and camera (resectoscope) will be inserted into your vagina and moved into your uterus. This allows your surgeon to see inside your uterus.  Endometrial tissue will be removed using one of the following methods:  Radiofrequency. This method uses a radiofrequency-alternating electric current to remove the endometrium.  Cryotherapy. This method uses extreme cold to freeze the endometrium.  Heated-free liquid. This method uses a heated saltwater (saline) solution to remove the endometrium.  Microwave. This method uses high-energy microwaves to heat up the endometrium and remove it.  Thermal balloon. This method involves inserting a catheter with a balloon tip into   the uterus. The balloon tip is filled with heated fluid to remove the endometrium.  The procedure may vary among health care providers and hospitals.  What happens after the procedure?  Your blood pressure, heart rate, breathing rate, and blood oxygen level will be monitored until the medicines you were given have worn off.  As tissue healing occurs, you may notice vaginal bleeding for 4-6 weeks after the procedure. You may also  experience:  Cramps.  Thin, watery vaginal discharge that is light pink or brown in color.  A need to urinate more frequently than usual.  Nausea.  Do not drive for 24 hours if you were given a sedative.  Do not have sex or insert anything into your vagina until your health care provider approves.  Summary  Endometrial ablation is done to treat the many causes of heavy menstrual bleeding.  The procedure may be done only after medications have been tried to control the bleeding.  Plan to have someone take you home from the hospital or clinic.  This information is not intended to replace advice given to you by your health care provider. Make sure you discuss any questions you have with your health care provider.  Document Revised: 08/07/2017 Document Reviewed: 03/09/2016  Elsevier Patient Education  2020 [employer]

## 2020-03-19 ENCOUNTER — Emergency Department: Payer: BC Managed Care – PPO

## 2020-03-19 ENCOUNTER — Other Ambulatory Visit: Payer: Self-pay

## 2020-03-19 DIAGNOSIS — I129 Hypertensive chronic kidney disease with stage 1 through stage 4 chronic kidney disease, or unspecified chronic kidney disease: Secondary | ICD-10-CM | POA: Insufficient documentation

## 2020-03-19 DIAGNOSIS — R779 Abnormality of plasma protein, unspecified: Secondary | ICD-10-CM | POA: Insufficient documentation

## 2020-03-19 DIAGNOSIS — N189 Chronic kidney disease, unspecified: Secondary | ICD-10-CM | POA: Diagnosis not present

## 2020-03-19 DIAGNOSIS — Z79899 Other long term (current) drug therapy: Secondary | ICD-10-CM | POA: Insufficient documentation

## 2020-03-19 DIAGNOSIS — R0789 Other chest pain: Secondary | ICD-10-CM | POA: Insufficient documentation

## 2020-03-19 DIAGNOSIS — R0600 Dyspnea, unspecified: Secondary | ICD-10-CM | POA: Insufficient documentation

## 2020-03-19 DIAGNOSIS — R0602 Shortness of breath: Secondary | ICD-10-CM | POA: Diagnosis not present

## 2020-03-19 LAB — BASIC METABOLIC PANEL
Anion gap: 6 (ref 5–15)
BUN: 12 mg/dL (ref 6–20)
CO2: 30 mmol/L (ref 22–32)
Calcium: 8.4 mg/dL — ABNORMAL LOW (ref 8.9–10.3)
Chloride: 104 mmol/L (ref 98–111)
Creatinine, Ser: 0.97 mg/dL (ref 0.44–1.00)
GFR, Estimated: >60 mL/min (ref >60)
Glucose, Bld: 109 mg/dL — ABNORMAL HIGH (ref 70–99)
Potassium: 3.9 mmol/L (ref 3.5–5.1)
Sodium: 140 mmol/L (ref 135–145)

## 2020-03-19 LAB — CBC
HCT: 38 % (ref 36.0–46.0)
Hemoglobin: 12.4 g/dL (ref 12.0–15.0)
MCH: 30.1 pg (ref 26.0–34.0)
MCHC: 32.6 g/dL (ref 30.0–36.0)
MCV: 92.2 fL (ref 80.0–100.0)
Platelets: 205 10*3/uL (ref 150–400)
RBC: 4.12 MIL/uL (ref 3.87–5.11)
RDW: 12.8 % (ref 11.5–15.5)
WBC: 9.7 10*3/uL (ref 4.0–10.5)
nRBC: 0 % (ref 0.0–0.2)

## 2020-03-19 NOTE — ED Triage Notes (Signed)
Reports abnormal lab work today outpatient. Reports 571 d-dimer today, and experiencing SOB.   Pt has had recent COVID.

## 2020-03-20 ENCOUNTER — Emergency Department: Payer: BC Managed Care – PPO

## 2020-03-20 ENCOUNTER — Emergency Department
Admission: EM | Admit: 2020-03-20 | Discharge: 2020-03-20 | Disposition: A | Discharge status: Home / Self Care | Payer: BC Managed Care – PPO | Attending: Student in an Organized Health Care Education/Training Program | Admitting: Student in an Organized Health Care Education/Training Program

## 2020-03-20 ENCOUNTER — Encounter: Payer: Self-pay | Admitting: Radiology

## 2020-03-20 DIAGNOSIS — R0600 Dyspnea, unspecified: Secondary | ICD-10-CM

## 2020-03-20 MED ORDER — IOHEXOL 350 MG/ML SOLN
100.0000 mL | Freq: Once | INTRAVENOUS | Status: AC | PRN
  Administered 2020-03-20: 100 mL via INTRAVENOUS

## 2020-03-20 NOTE — ED Provider Notes (Signed)
[facility] Emergency Department Provider Note    None    (approximate)  I have reviewed the triage vital signs and the nursing notes.   HISTORY  Chief Complaint Abnormal Lab and Shortness of Breath    HPI Kirsten Cruz is a 50 y.o. female with recent COVID illness presents to ER due to elevated D-dimer.  Patient states that she has been having some generalized malaise persistent shortness of breath and some discomfort with deep inspiration has also noted some swelling her legs.  No history of DVT not currently on any OCP.  Denies any active chest pain now.    Past Medical History:  Diagnosis Date  . Anemia   . Chronic kidney disease   . Heart murmur    many years ago and her cardiologist Dr. Clayborn Bigness is managing this  . Hyperlipidemia   . Hypertension   . Irregular heart beat   . Thyroid disease    Family History  Problem Relation Age of Onset  . Breast cancer Mother 57  . Cancer Mother   . Diabetes Mother   . Varicose Veins Mother   . Cancer Father   . Diabetes Sister    Past Surgical History:  Procedure Laterality Date  . CESAREAN SECTION  02/11/1997  . CHOLECYSTECTOMY  2016  . Mirena   07/27/2017  . TOE SURGERY Left 1999   Patient Active Problem List   Diagnosis Date Noted  . Lymphedema 04/29/2018  . Menorrhagia with regular cycle 07/27/2017  . Goiter 07/05/2017  . Migraines 07/05/2017  . Hyperlipidemia, unspecified 07/05/2017  . 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 CKD (chronic kidney disease) stage 3, GFR 30-59 ml/min (HCC) 12/27/2016  . Pure hypercholesterolemia 12/27/2016  . Anxiety 08/03/2016  . Hepatic lesion 12/23/2015  . Hepatic steatosis 12/03/2014  . Edema, peripheral 04/14/2014  . Hashimoto's thyroiditis 10/27/2011      Prior to Admission medications   Medication Sig Start Date End Date Taking? Authorizing Provider  atorvastatin (LIPITOR) 40 MG tablet Take 40 mg by mouth daily. 06/19/17    [provider]  B Complex Vitamins (B-COMPLEX/B-12) TABS Per MD, to help with trending lower b12 levels 06/28/17   [provider]  clonazePAM (KLONOPIN) 0.5 MG tablet TAKE 1 TABLET BY MOUTH ONCE A DAY AS NEEDED FOR ANXIETY FOR UP TO 1 DOSE 11/16/16 04/18/18  [provider]  cyclobenzaprine (FLEXERIL) 10 MG tablet cyclobenzaprine 10 mg tablet Patient not taking: Reported on 01/05/2020 03/07/16   [provider]  levonorgestrel (MIRENA) 20 MCG/24HR IUD 1 Intra Uterine Device (1 each total) by Intrauterine route once for 1 dose. 07/27/17 04/18/18  Will Bonnet, MD  naproxen (NAPROSYN) 500 MG tablet Take 1 tablet (500 mg total) by mouth 2 (two) times daily with a meal. 08/17/15   Lavonia Drafts, MD  propranolol (INDERAL) 10 MG tablet Take by mouth. 10/09/18   [provider]  topiramate (TOPAMAX) 50 MG tablet Take by mouth. 07/16/17 07/16/18  [provider]  triamterene-hydrochlorothiazide (DYAZIDE) 37.5-25 MG capsule Take by mouth. 12/26/16 04/18/18  [provider]  valACYclovir (VALTREX) 500 MG tablet One po bid for flare x 3 days 04/06/17   [provider]    Allergies Buspirone, Pravastatin, Morphine, and Morphine and related    Social History Social History   Tobacco Use  . Smoking status: Never Smoker  . Smokeless tobacco: Never Used  Vaping Use  . Vaping Use: Never used  Substance Use  Topics  . Alcohol use: No  . Drug use: Never    Review of Systems Patient denies headaches, rhinorrhea, blurry vision, numbness, shortness of breath, chest pain, edema, cough, abdominal pain, nausea, vomiting, diarrhea, dysuria, fevers, rashes or hallucinations unless otherwise stated above in HPI. ____________________________________________   PHYSICAL EXAM:  VITAL SIGNS: Vitals:   03/20/20 0052 03/20/20 0331  BP: 133/79 (!) 145/91  Pulse: 68 61  Resp: 18 16  Temp: 98.2 F (36.8 C)   SpO2: 100% 100%    Constitutional:  Alert and oriented.  Eyes: Conjunctivae are normal.  Head: Atraumatic. Nose: No congestion/rhinnorhea. Mouth/Throat: Mucous membranes are moist.   Neck: No stridor. Painless ROM.  Cardiovascular: Normal rate, regular rhythm. Grossly normal heart sounds.  Good peripheral circulation. Respiratory: Normal respiratory effort.  No retractions. Lungs CTAB. Gastrointestinal: Soft and nontender. No distention. No abdominal bruits. No CVA tenderness. Genitourinary:  Musculoskeletal: No lower extremity tenderness nor edema.  No joint effusions. Neurologic:  Normal speech and language. No gross focal neurologic deficits are appreciated. No facial droop Skin:  Skin is warm, dry and intact. No rash noted. Psychiatric: Mood and affect are normal. Speech and behavior are normal.  ____________________________________________   LABS (all labs ordered are listed, but only abnormal results are displayed)  Results for orders placed or performed during the hospital encounter of 03/20/20 (from the past 24 hour(s))  Basic metabolic panel     Status: Abnormal   Collection Time: 03/19/20  5:18 PM  Result Value Ref Range   Sodium 140 135 - 145 mmol/L   Potassium 3.9 3.5 - 5.1 mmol/L   Chloride 104 98 - 111 mmol/L   CO2 30 22 - 32 mmol/L   Glucose, Bld 109 (H) 70 - 99 mg/dL   BUN 12 6 - 20 mg/dL   Creatinine, Ser 0.97 0.44 - 1.00 mg/dL   Calcium 8.4 (L) 8.9 - 10.3 mg/dL   GFR, Estimated >60 >60 mL/min   Anion gap 6 5 - 15  CBC     Status: None   Collection Time: 03/19/20  5:18 PM  Result Value Ref Range   WBC 9.7 4.0 - 10.5 K/uL   RBC 4.12 3.87 - 5.11 MIL/uL   Hemoglobin 12.4 12.0 - 15.0 g/dL   HCT 38.0 36.0 - 46.0 %   MCV 92.2 80.0 - 100.0 fL   MCH 30.1 26.0 - 34.0 pg   MCHC 32.6 30.0 - 36.0 g/dL   RDW 12.8 11.5 - 15.5 %   Platelets 205 150 - 400 K/uL   nRBC 0.0 0.0 - 0.2 %   ____________________________________________  EKG My review and personal interpretation at Time: 17:17   Indication:  sob  Rate: 85  Rhythm: sinus Axis: normal Other: normal intervals, nos temi ____________________________________________  RADIOLOGY  I personally reviewed all radiographic images ordered to evaluate for the above acute complaints and reviewed radiology reports and findings.  These findings were personally discussed with the patient.  Please see medical record for radiology report.  ____________________________________________   PROCEDURES  Procedure(s) performed:  Procedures    Critical Care performed: no ____________________________________________   INITIAL IMPRESSION / ASSESSMENT AND PLAN / ED COURSE  Pertinent labs & imaging results that were available during my care of the patient were reviewed by me and considered in my medical decision making (see chart for details).   DDX: long covid, pe, pleurisy, acs, pna  Kirsten Cruz is a 50 y.o. who presents to the ED with presentation as described  above.  Patient well and nontoxic.  D-dimer only mildly elevated but as she has recently status post COVID with persistent symptoms will order CTA as well as duplex to evaluate for DVT.  Not consistent with pneumothorax and no evidence of pneumonia.  Not consistent with ACS.  CTA was reassuring as well as lower extremity duplex studies.  Patient stable and appropriate for outpatient follow-up     The patient was evaluated in Emergency Department today for the symptoms described in the history of present illness. He/she was evaluated in the context of the global COVID-19 pandemic, which necessitated consideration that the patient might be at risk for infection with the SARS-CoV-2 virus that causes COVID-19. Institutional protocols and algorithms that pertain to the evaluation of patients at risk for COVID-19 are in a state of rapid change based on information released by regulatory bodies including the CDC and federal and state organizations. These policies and algorithms were followed during  the patient's care in the ED.  As part of my medical decision making, I reviewed the following data within the [place] notes reviewed and incorporated, Labs reviewed, notes from prior ED visits and [place] Controlled Substance Database   ____________________________________________   FINAL CLINICAL IMPRESSION(S) / ED DIAGNOSES  Final diagnoses:  Dyspnea, unspecified type      NEW MEDICATIONS STARTED DURING THIS VISIT:  New Prescriptions   No medications on file     Note:  This document was prepared using Dragon voice recognition software and may include unintentional dictation errors.    Merlyn Lot, MD 03/20/20 [phone]

## 2020-03-20 NOTE — ED Notes (Signed)
Pt to ct 

## 2020-08-27 ENCOUNTER — Other Ambulatory Visit: Payer: Self-pay | Admitting: Family Medicine

## 2020-08-27 DIAGNOSIS — Z1231 Encounter for screening mammogram for malignant neoplasm of breast: Secondary | ICD-10-CM

## 2020-09-14 ENCOUNTER — Other Ambulatory Visit: Payer: Self-pay

## 2020-09-14 ENCOUNTER — Ambulatory Visit
Admission: RE | Admit: 2020-09-14 | Discharge: 2020-09-14 | Disposition: A | Discharge status: Home / Self Care | Payer: BC Managed Care – PPO | Source: Ambulatory Visit | Attending: Family Medicine | Admitting: Family Medicine

## 2020-09-14 DIAGNOSIS — Z1231 Encounter for screening mammogram for malignant neoplasm of breast: Secondary | ICD-10-CM | POA: Diagnosis present

## 2021-03-14 ENCOUNTER — Telehealth: Payer: Self-pay

## 2021-03-14 NOTE — Telephone Encounter (Signed)
Patient is scheduled for 03/15/21

## 2021-03-14 NOTE — Telephone Encounter (Signed)
Pt called after hour nurse 03/14/21 4:57am; has been bleeding for about 3wks; has IUD which was placed 07/27/2017.  [phone]

## 2021-03-15 ENCOUNTER — Encounter: Payer: Self-pay | Admitting: Obstetrics and Gynecology

## 2021-03-15 ENCOUNTER — Telehealth: Payer: Self-pay

## 2021-03-15 ENCOUNTER — Other Ambulatory Visit: Payer: Self-pay

## 2021-03-15 ENCOUNTER — Ambulatory Visit: Payer: BC Managed Care – PPO | Admitting: Obstetrics and Gynecology

## 2021-03-15 VITALS — BP 120/70 | Ht 65.0 in | Wt 199.0 lb

## 2021-03-15 DIAGNOSIS — Z30432 Encounter for removal of intrauterine contraceptive device: Secondary | ICD-10-CM | POA: Diagnosis not present

## 2021-03-15 DIAGNOSIS — T8332XA Displacement of intrauterine contraceptive device, initial encounter: Secondary | ICD-10-CM

## 2021-03-15 NOTE — Telephone Encounter (Signed)
Patient is calling to scheduled Ablation with IUD removal. Patient is  supposed to have imaging done prior but isn't scheduled. Please advise

## 2021-03-15 NOTE — Patient Instructions (Signed)
Hysteroscopy Hysteroscopy is a procedure used to look inside a woman's womb (uterus). This may be done for various reasons, including: To look for tumors and other growths in the uterus. To evaluate abnormal bleeding, fibroid tumors, polyps, scar tissue, or uterine cancer. To determine why a woman is unable to get pregnant or has had repeated pregnancy losses. To locate an IUD (intrauterine device). To place a birth control device into the fallopian tubes. During this procedure, a thin, flexible tube with a small light and camera (hysteroscope) is used to examine the uterus. The camera sends images to a monitor in the room so that your health care provider can view the inside of your uterus. A hysteroscopy should be done right after a menstrual period. Tell a health care provider about: Any allergies you have. All medicines you are taking, including vitamins, herbs, eye drops, creams, and over-the-counter medicines. Any problems you or family members have had with anesthetic medicines. Any blood disorders you have. Any surgeries you have had. Any medical conditions you have. Whether you are pregnant or may be pregnant. Whether you have been diagnosed with an STI (sexually transmitted infection) or you think you have an STI. What are the risks? Generally, this is a safe procedure. However, problems may occur, including: Excessive bleeding. Infection. Damage to the uterus or other structures or organs. Allergic reaction to medicines or fluids that are used in the procedure. What happens before the procedure? Staying hydrated Follow instructions from your health care provider about hydration, which may include: Up to 2 hours before the procedure - you may continue to drink clear liquids, such as water, clear fruit juice, black coffee, and plain tea. Eating and drinking restrictions Follow instructions from your health care provider about eating and drinking, which may include: 8 hours  before the procedure - stop eating solid foods and drink clear liquids only. 2 hours before the procedure - stop drinking clear liquids. Medicines Ask your health care provider about: Changing or stopping your regular medicines. This is especially important if you are taking diabetes medicines or blood thinners. Taking medicines such as aspirin and ibuprofen. These medicines can thin your blood. Do not take these medicines unless your health care provider tells you to take them. Taking over-the-counter medicines, vitamins, herbs, and supplements. Medicine may be placed in your cervix the day before the procedure. This medicine causes the cervix to open (dilate). The larger opening makes it easier for the hysteroscope to be inserted into the uterus during the procedure. General instructions Ask your health care provider: What steps will be taken to help prevent infection. These steps may include: Washing skin with a germ-killing soap. Taking antibiotic medicine. Do not use any products that contain nicotine or tobacco for at least 4 weeks before the procedure. These products include cigarettes, chewing tobacco, and vaping devices, such as e-cigarettes. If you need help quitting, ask your health care provider. Plan to have a responsible adult take you home from the hospital or clinic. Plan to have a responsible adult care for you for the time you are told after you leave the hospital or clinic. This is important. Empty your bladder before the procedure begins. What happens during the procedure? An IV will be inserted into one of your veins. You may be given: A medicine to help you relax (sedative). A medicine that numbs the area around the cervix (local anesthetic). A medicine to make you fall asleep (general anesthetic). A hysteroscope will be inserted through your vagina  and into your uterus. Air or fluid will be used to enlarge your uterus to allow your health care provider to see it better.  The amount of fluid used will be carefully checked throughout the procedure. In some cases, tissue may be gently scraped from inside the uterus and sent to a lab for testing (biopsy). The procedure may vary among health care providers and hospitals. What can I expect after the procedure? Your blood pressure, heart rate, breathing rate, and blood oxygen level will be monitored until you leave the hospital or clinic. You may have cramps. You may be given medicines for this. You may have bleeding, which may vary from light spotting to menstrual-like bleeding. This is normal. If you had a biopsy, it is up to you to get the results. Ask your health care provider, or the department that is doing the procedure, when your results will be ready. Follow these instructions at home: Activity Rest as told by your health care provider. Return to your normal activities as told by your health care provider. Ask your health care provider what activities are safe for you. If you were given a sedative during the procedure, it can affect you for several hours. Do not drive or operate machinery until your health care provider says that it is safe. Medicines Do not take aspirin or other NSAIDs during recovery, as told by your healthcare provider. It can increase the risk of bleeding. Ask your health care provider if the medicine prescribed to you: Requires you to avoid driving or using machinery. Can cause constipation. You may need to take these actions to prevent or treat constipation: Drink enough fluid to keep your urine pale yellow. Take over-the-counter or prescription medicines. Eat foods that are high in fiber, such as beans, whole grains, and fresh fruits and vegetables. Limit foods that are high in fat and processed sugars, such as fried or sweet foods. General instructions Do not douche, use tampons, or have sex for 2 weeks after the procedure, or until your health care provider approves. Do not take  baths, swim, or use a hot tub until your health care provider approves. Take showers instead of baths for 2 weeks, or for as long as told by your health care provider. Keep all follow-up visits. This is important. Contact a health care provider if: You feel dizzy or lightheaded. You feel nauseous. You have abnormal vaginal discharge. You have a rash. You have pain that does not get better with medicine. You have chills. Get help right away if: You have bleeding that is heavier than a normal menstrual period. You have a fever. You have pain or cramps that get worse. You develop new abdominal pain. You faint. You have pain in your shoulder. You are short of breath. Summary Hysteroscopy is a procedure that is used to look inside a woman's womb (uterus). After the procedure, you may have bleeding, which varies from light spotting to menstrual-like bleeding. This is normal. You may also have cramps. Do not douche, use tampons, or have sex for 2 weeks after the procedure, or until your health care provider approves. Plan to have a responsible adult take you home from the hospital or clinic. This information is not intended to replace advice given to you by your health care provider. Make sure you discuss any questions you have with your health care provider. Document Revised: 11/03/2020 Document Reviewed: 10/08/2019 Elsevier Patient Education  2022 [employer].

## 2021-03-15 NOTE — Progress Notes (Signed)
Patient ID: Kirsten Cruz, female   DOB: 04/08/1970, 51 y.o.   MRN: [id number]  Reason for Consult: Gynecologic Exam and Procedure   Referred by Gayland Curry, MD  Subjective:     HPI:  Kirsten Cruz is a 51 y.o. female she is here today for an IUD removal.  She reports that she has been having ongoing spotting for several years with her Mirena IUD and is frustrated with the device and would like to have it removed.  She is interested in a uterine ablation and is considering this procedure.  Gynecological History  No LMP recorded. (Menstrual status: IUD).  Past Medical History:  Diagnosis Date   Anemia    Chronic kidney disease    Heart murmur    many years ago and her cardiologist Dr. Clayborn Bigness is managing this   Hyperlipidemia    Hypertension    Irregular heart beat    Thyroid disease    Family History  Problem Relation Age of Onset   Breast cancer Mother 98   Cancer Mother    Diabetes Mother    Varicose Veins Mother    Cancer Father    Diabetes Sister    Past Surgical History:  Procedure Laterality Date   CESAREAN SECTION  02/11/1997   CHOLECYSTECTOMY  2016   Mirena   07/27/2017   TOE SURGERY Left 1999    Short Social History:  Social History   Tobacco Use   Smoking status: Never   Smokeless tobacco: Never  Substance Use Topics   Alcohol use: No    Allergies  Allergen Reactions   Buspirone Nausea Only    Other reaction(s): Headache   Pravastatin Other (See Comments)    Pt believes she had a seizure with it.   Morphine Rash   Morphine And Related Rash    Current Outpatient Medications  Medication Sig Dispense Refill   atorvastatin (LIPITOR) 40 MG tablet Take 40 mg by mouth daily.  3   B Complex Vitamins (B-COMPLEX/B-12) TABS Per MD, to help with trending lower b12 levels     naproxen (NAPROSYN) 500 MG tablet Take 1 tablet (500 mg total) by mouth 2 (two) times daily with a meal. 20 tablet 2   propranolol (INDERAL) 10 MG tablet Take by  mouth.     valACYclovir (VALTREX) 500 MG tablet One po bid for flare x 3 days     clonazePAM (KLONOPIN) 0.5 MG tablet TAKE 1 TABLET BY MOUTH ONCE A DAY AS NEEDED FOR ANXIETY FOR UP TO 1 DOSE     cyclobenzaprine (FLEXERIL) 10 MG tablet cyclobenzaprine 10 mg tablet (Patient not taking: No sig reported)     levonorgestrel (MIRENA) 20 MCG/24HR IUD 1 Intra Uterine Device (1 each total) by Intrauterine route once for 1 dose. 1 each 0   topiramate (TOPAMAX) 50 MG tablet Take by mouth.     triamterene-hydrochlorothiazide (DYAZIDE) 37.5-25 MG capsule Take by mouth.     No current facility-administered medications for this visit.    Review of Systems  Constitutional: Negative for chills, fatigue, fever and unexpected weight change.  HENT: Negative for trouble swallowing.  Eyes: Negative for loss of vision.  Respiratory: Negative for cough, shortness of breath and wheezing.  Cardiovascular: Negative for chest pain, leg swelling, palpitations and syncope.  GI: Negative for abdominal pain, blood in stool, diarrhea, nausea and vomiting.  GU: Negative for difficulty urinating, dysuria, frequency and hematuria.  Musculoskeletal: Negative for back pain, leg pain and joint pain.  Skin:  Negative for rash.  Neurological: Negative for dizziness, headaches, light-headedness, numbness and seizures.  Psychiatric: Negative for behavioral problem, confusion, depressed mood and sleep disturbance.       Objective:  Objective   Vitals:   03/15/21 1431  BP: 120/70  Weight: 199 lb (90.3 kg)  Height: 5\' 5"  (1.651 m)   Body mass index is 33.12 kg/m.  Physical Exam Vitals and nursing note reviewed. Exam conducted with a chaperone present.  Constitutional:      Appearance: Normal appearance. She is well-developed.  HENT:     Head: Normocephalic and atraumatic.  Eyes:     Extraocular Movements: Extraocular movements intact.     Pupils: Pupils are equal, round, and reactive to light.  Cardiovascular:      Rate and Rhythm: Normal rate and regular rhythm.  Pulmonary:     Effort: Pulmonary effort is normal. No respiratory distress.     Breath sounds: Normal breath sounds.  Abdominal:     General: Abdomen is flat.     Palpations: Abdomen is soft.  Genitourinary:    Comments: External: Normal appearing vulva. No lesions noted.  Speculum examination: Normal appearing cervix. No blood in the vaginal vault. No discharge.   IUD strings NOT seen Bimanual examination: Uterus midline, non-tender, normal in size, shape and contour.  No CMT. No adnexal masses. No adnexal tenderness. Pelvis not fixed.  Musculoskeletal:        General: No signs of injury.  Skin:    General: Skin is warm and dry.  Neurological:     Mental Status: She is alert and oriented to person, place, and time.  Psychiatric:        Behavior: Behavior normal.        Thought Content: Thought content normal.        Judgment: Judgment normal.   IUD removal procedure-not successful IUD strings were not visualized at cervical os.  A Cytobrush was used to try and tease the strings from the internal cervical canal but the strings were not found.  A Bozeman and IUD hook were used to also try and remove the IUD but this was not successful.  Procedure was discontinued and plans were made for hysteroscopy to remove the IUD.   Assessment/Plan:     51 year old here for IUD removal.  This procedure was discontinued because the strings of the IUD were lost.  Patient will follow-up for hysteroscopy to remove the strings.  She is considering an endometrial ablation which could be performed at the same time if desired.  Ultrasound ordered to confirm location of the IUD in the uterus.  More than 20 minutes were spent face to face with the patient in the room, reviewing the medical record, labs and images, and coordinating care for the patient. The plan of management was discussed in detail and counseling was provided.    Adrian Prows  MD [facility] 03/15/2021 4:52 PM

## 2021-04-08 NOTE — Telephone Encounter (Signed)
-----   Message from [place] Lodge sent at 03/22/2021  4:58 PM EST ----- Regarding: FW: surgery schedule Ultrasound scheduled for 04/20/21 @ 10am, okay to schedule surgery for a date after the ultrasound. Also, per Dr. Gilman Schmidt, Hysteroscopy, endometrial ablation, IUD removal. ----- Message ----- From: Homero Fellers, MD Sent: 03/15/2021   2:57 PM EST To: [place] Lodge Subject: surgery schedule                               Surgery Booking Request Patient Full Name:  Kirsten Cruz  MRN: [id number]  DOB: 1970-08-22  Surgeon: Homero Fellers, MD  Requested Surgery Date and Time: next month Primary Diagnosis AND Code: lost iud strings Secondary Diagnosis and Code:  Surgical Procedure: Hysteroscopy with IUD removal RNFA Requested?: No L&D Notification: No Admission Status: same day surgery Length of Surgery: 25 min Special Case Needs: No H&P: No Phone Interview???:  No Interpreter: No Medical Clearance:  No Special Scheduling Instructions: No Any known health/anesthesia issues, diabetes, sleep apnea, latex allergy, defibrillator/pacemaker?: No Acuity: P2   (P1 highest, P2 delay may cause harm, P3 low, elective gyn, P4 lowest) Post op follow up visits:1 week post op

## 2021-04-08 NOTE — Telephone Encounter (Signed)
Patient is calling today to see if we have her ultrasound imaging from [facility] to set up her surgery for IUD removal with CRS. Please advise?

## 2021-04-08 NOTE — Telephone Encounter (Signed)
Called patient to schedule Hysteroscopy D&C with ablation, removal of IUD w Schuman  DOS 2/14  H&P  n/a  Pre-admit phone call appointment to be requested. Also all appointments will be updated on pt MyChart. Explained that this appointment has a call window. Based on the time scheduled will indicate if the call will be received within a 4 hour window before 1:00 or after.  Advised that pt may also receive calls from the hospital pharmacy and pre-service center.  Confirmed pt has [employer] as Chartered certified accountant. No secondary insurance.   Pt pelvic u/s report is in chart under care everywhere. It was done at [facility] 04/05/21.

## 2021-04-11 NOTE — Telephone Encounter (Signed)
Orders placed.

## 2021-04-14 ENCOUNTER — Encounter
Admission: RE | Admit: 2021-04-14 | Discharge: 2021-04-14 | Disposition: A | Discharge status: Home / Self Care | Payer: BC Managed Care – PPO | Source: Ambulatory Visit | Attending: Obstetrics and Gynecology | Admitting: Obstetrics and Gynecology

## 2021-04-14 ENCOUNTER — Other Ambulatory Visit: Payer: Self-pay

## 2021-04-14 HISTORY — DX: COVID-19: U07.1

## 2021-04-14 NOTE — Pre-Procedure Instructions (Signed)
Patient lives in [place] and is unable to come to [place] for pre op labs and EKG. Will need to be done DOS.

## 2021-04-14 NOTE — Patient Instructions (Addendum)
Your procedure is scheduled on: 04/19/21 Report to [place]. To find out your arrival time please call [phone] between 1PM - 3PM on 04/18/21 .  Remember: Instructions that are not followed completely may result in serious medical risk, up to and including death, or upon the discretion of your surgeon and anesthesiologist your surgery may need to be rescheduled.     _X__ 1. Do not eat food after midnight the night before your procedure.                 No gum chewing or hard candies. You may drink clear liquids up to 2 hours                 before you are scheduled to arrive for your surgery- DO not drink clear                 liquids within 2 hours of the start of your surgery.                 Clear Liquids include:  water, apple juice without pulp, clear carbohydrate                 drink such as Clearfast or Gatorade, Black Coffee or Tea (Do not add                 anything to coffee or tea). Diabetics water only  __X__2.  On the morning of surgery brush your teeth with toothpaste and water, you                 may rinse your mouth with mouthwash if you wish.  Do not swallow any              toothpaste of mouthwash.     _X__ 3.  No Alcohol for 24 hours before or after surgery.   _X__ 4.  Do Not Smoke or use e-cigarettes For 24 Hours Prior to Your Surgery.                 Do not use any chewable tobacco products for at least 6 hours prior to                 surgery.  ____  5.  Bring all medications with you on the day of surgery if instructed.   __X__  6.  Notify your doctor if there is any change in your medical condition      (cold, fever, infections).     Do not wear jewelry, make-up, hairpins, clips or nail polish. Do not wear lotions, powders, or perfumes.  Do not shave 48 hours prior to surgery. Men may shave face and neck. Do not bring valuables to the hospital.    [facility] is not responsible for any belongings  or valuables.  Contacts, dentures/partials or body piercings may not be worn into surgery. Bring a case for your contacts, glasses or hearing aids, a denture cup will be supplied. Leave your suitcase in the car. After surgery it may be brought to your room. For patients admitted to the hospital, discharge time is determined by your treatment team.   Patients discharged the day of surgery will not be allowed to drive home.   Please read over the following fact sheets that you were given:     __X__ Take these medicines the morning of surgery with A SIP OF WATER:  1. propranolol (INDERAL) 10 MG tablet  2.   3.   4.  5.  6.  ____ Fleet Enema (as directed)   ____ Use CHG Soap/SAGE wipes as directed  ____ Use inhalers on the day of surgery  ____ Stop metformin/Janumet/Farxiga 2 days prior to surgery    ____ Take 1/2 of usual insulin dose the night before surgery. No insulin the morning          of surgery.   ____ Stop Blood Thinners Coumadin/Plavix/Xarelto/Pleta/Pradaxa/Eliquis/Effient/Aspirin  on   Or contact your Surgeon, Cardiologist or Medical Doctor regarding  ability to stop your blood thinners  __X__ Stop Anti-inflammatories 7 days before surgery such as Advil, Ibuprofen, Motrin,  BC or Goodies Powder, Naprosyn, Naproxen, Aleve, Aspirin   MAY TAKE TYLENOL IF NEEDED  __X__ Stop all herbals and supplements, fish oil or vitamins  until after surgery.    ____ Bring C-Pap to the hospital.

## 2021-04-18 NOTE — Anesthesia Preprocedure Evaluation (Addendum)
Anesthesia Evaluation  Patient identified by MRN, date of birth, ID band Patient awake    Reviewed: Allergy & Precautions, H&P , NPO status , Patient's Chart, lab work & pertinent test results, reviewed documented beta blocker date and time   History of Anesthesia Complications Negative for: history of anesthetic complications  Airway Mallampati: II  TM Distance: >3 FB Neck ROM: full    Dental  (+) Dental Advidsory Given, Caps, Missing, Teeth Intact   Pulmonary neg pulmonary ROS,    Pulmonary exam normal breath sounds clear to auscultation       Cardiovascular Exercise Tolerance: Good hypertension, (-) angina(-) Past MI and (-) Cardiac Stents Normal cardiovascular exam(-) dysrhythmias + Valvular Problems/Murmurs  Rhythm:regular Rate:Normal  ECG 04/17/20:  NORMAL SINUS RHYTHM  MODERATE VOLTAGE CRITERIA FOR LVH, MAY BE NORMAL VARIANT ( R in aVL , [employer] product )    Neuro/Psych PSYCHIATRIC DISORDERS Anxiety negative neurological ROS     GI/Hepatic negative GI ROS, Neg liver ROS,   Endo/Other  negative endocrine ROS  Renal/GU CRFRenal disease (stage III CKD)  negative genitourinary   Musculoskeletal   Abdominal   Peds  Hematology  (+) Blood dyscrasia, anemia ,   Anesthesia Other Findings Cardiology note 07/30/20:  ASSESSMENT / PLAN Kirsten Cruz is a 51 y.o. female with a history of COVID-19 infection 02/2020, Hashimoto's thyroiditis, HTN, HLD, migraines who is seen follow up.  1. Palpitations Did not recurrence since her last visit. Her Ziopatch was unremarkable. 3 patient triggered events were associated with sinus rhythm. She is reassured with unremarkable findings. Questions were answered, she is instructed to continue monitor her symptoms, and call us back or seek urgent medical care if her symptoms worsen or fail to improve.  Return if symptoms worsen or fail to improve.   Reproductive/Obstetrics negative OB  ROS                            Anesthesia Physical Anesthesia Plan  ASA: 2  Anesthesia Plan: General   Post-op Pain Management:    Induction: Intravenous  PONV Risk Score and Plan: 3 and Propofol infusion, TIVA, Treatment may vary due to age or medical condition and Ondansetron  Airway Management Planned: Natural Airway  Additional Equipment:   Intra-op Plan:   Post-operative Plan:   Informed Consent: I have reviewed the patients History and Physical, chart, labs and discussed the procedure including the risks, benefits and alternatives for the proposed anesthesia with the patient or authorized representative who has indicated his/her understanding and acceptance.     Dental Advisory Given  Plan Discussed with: CRNA  Anesthesia Plan Comments: (LMA/GETA backup discussed.  Patient consented for risks of anesthesia including but not limited to:  - adverse reactions to medications - damage to eyes, teeth, lips or other oral mucosa - nerve damage due to positioning  - sore throat or hoarseness - damage to heart, brain, nerves, lungs, other parts of body or loss of life  Informed patient about role of CRNA in peri- and intra-operative care.  Patient voiced understanding.)       Anesthesia Quick Evaluation

## 2021-04-19 ENCOUNTER — Other Ambulatory Visit: Payer: Self-pay

## 2021-04-19 ENCOUNTER — Encounter: Payer: Self-pay | Admitting: Obstetrics and Gynecology

## 2021-04-19 ENCOUNTER — Ambulatory Visit: Payer: BC Managed Care – PPO | Admitting: Anesthesiology

## 2021-04-19 ENCOUNTER — Encounter
Admission: RE | Disposition: A | Discharge status: Home / Self Care | Payer: Self-pay | Source: Home / Self Care | Attending: Obstetrics and Gynecology

## 2021-04-19 ENCOUNTER — Ambulatory Visit
Admission: RE | Admit: 2021-04-19 | Discharge: 2021-04-19 | Disposition: A | Discharge status: Home / Self Care | Payer: BC Managed Care – PPO | Attending: Obstetrics and Gynecology | Admitting: Obstetrics and Gynecology

## 2021-04-19 DIAGNOSIS — Z975 Presence of (intrauterine) contraceptive device: Secondary | ICD-10-CM | POA: Insufficient documentation

## 2021-04-19 DIAGNOSIS — T8332XA Displacement of intrauterine contraceptive device, initial encounter: Secondary | ICD-10-CM | POA: Diagnosis not present

## 2021-04-19 DIAGNOSIS — Z30432 Encounter for removal of intrauterine contraceptive device: Secondary | ICD-10-CM

## 2021-04-19 DIAGNOSIS — N189 Chronic kidney disease, unspecified: Secondary | ICD-10-CM | POA: Insufficient documentation

## 2021-04-19 DIAGNOSIS — I129 Hypertensive chronic kidney disease with stage 1 through stage 4 chronic kidney disease, or unspecified chronic kidney disease: Secondary | ICD-10-CM | POA: Insufficient documentation

## 2021-04-19 DIAGNOSIS — N920 Excessive and frequent menstruation with regular cycle: Secondary | ICD-10-CM | POA: Diagnosis present

## 2021-04-19 DIAGNOSIS — N921 Excessive and frequent menstruation with irregular cycle: Secondary | ICD-10-CM | POA: Diagnosis not present

## 2021-04-19 HISTORY — PX: DILITATION & CURRETTAGE/HYSTROSCOPY WITH NOVASURE ABLATION: SHX5568

## 2021-04-19 HISTORY — PX: IUD REMOVAL: SHX5392

## 2021-04-19 LAB — TYPE AND SCREEN
ABO/RH(D): O POS
Antibody Screen: NEGATIVE

## 2021-04-19 LAB — CBC
HCT: 37.3 % (ref 36.0–46.0)
Hemoglobin: 12.1 g/dL (ref 12.0–15.0)
MCH: 30 pg (ref 26.0–34.0)
MCHC: 32.4 g/dL (ref 30.0–36.0)
MCV: 92.3 fL (ref 80.0–100.0)
Platelets: 174 10*3/uL (ref 150–400)
RBC: 4.04 MIL/uL (ref 3.87–5.11)
RDW: 12.4 % (ref 11.5–15.5)
WBC: 5.4 10*3/uL (ref 4.0–10.5)
nRBC: 0 % (ref 0.0–0.2)

## 2021-04-19 LAB — POCT PREGNANCY, URINE: Preg Test, Ur: NEGATIVE

## 2021-04-19 SURGERY — DILATATION & CURETTAGE/HYSTEROSCOPY WITH NOVASURE ABLATION
Anesthesia: General

## 2021-04-19 MED ORDER — SODIUM CHLORIDE 0.9 % IR SOLN
Status: DC | PRN
  Administered 2021-04-19: 3000 mL

## 2021-04-19 MED ORDER — MIDAZOLAM HCL 5 MG/5ML IJ SOLN
INTRAMUSCULAR | Status: DC | PRN
  Administered 2021-04-19: 2 mg via INTRAVENOUS

## 2021-04-19 MED ORDER — TRAMADOL HCL 50 MG PO TABS: 50.0000 mg | ORAL_TABLET | Freq: Four times a day (QID) | ORAL | 0 refills | Status: AC | PRN

## 2021-04-19 MED ORDER — ONDANSETRON HCL 4 MG/2ML IJ SOLN
4.0000 mg | Freq: Once | INTRAMUSCULAR | Status: AC | PRN
  Administered 2021-04-19: 4 mg via INTRAVENOUS

## 2021-04-19 MED ORDER — FENTANYL CITRATE (PF) 100 MCG/2ML IJ SOLN
INTRAMUSCULAR | Status: DC | PRN
  Administered 2021-04-19: 25 ug via INTRAVENOUS

## 2021-04-19 MED ORDER — DEXAMETHASONE SODIUM PHOSPHATE 10 MG/ML IJ SOLN
INTRAMUSCULAR | Status: DC | PRN
  Administered 2021-04-19: 10 mg via INTRAVENOUS

## 2021-04-19 MED ORDER — LIDOCAINE HCL (CARDIAC) PF 100 MG/5ML IV SOSY
PREFILLED_SYRINGE | INTRAVENOUS | Status: DC | PRN
  Administered 2021-04-19: 80 mg via INTRAVENOUS

## 2021-04-19 MED ORDER — FAMOTIDINE 20 MG PO TABS
20.0000 mg | ORAL_TABLET | Freq: Once | ORAL | Status: AC
  Administered 2021-04-19: 20 mg via ORAL

## 2021-04-19 MED ORDER — CHLORHEXIDINE GLUCONATE 0.12 % MT SOLN
OROMUCOSAL | Status: AC
  Filled 2021-04-19: qty 15

## 2021-04-19 MED ORDER — ORAL CARE MOUTH RINSE: 15.0000 mL | Freq: Once | OROMUCOSAL | Status: AC

## 2021-04-19 MED ORDER — LIDOCAINE HCL (PF) 2 % IJ SOLN
INTRAMUSCULAR | Status: AC
  Filled 2021-04-19: qty 5

## 2021-04-19 MED ORDER — DEXAMETHASONE SODIUM PHOSPHATE 10 MG/ML IJ SOLN
INTRAMUSCULAR | Status: AC
  Filled 2021-04-19: qty 1

## 2021-04-19 MED ORDER — PROPOFOL 10 MG/ML IV BOLUS
INTRAVENOUS | Status: DC | PRN
Start: 2021-04-19 — End: 2021-04-19
  Administered 2021-04-19: 100 ug/kg/min via INTRAVENOUS
  Administered 2021-04-19: 20 mg via INTRAVENOUS
  Administered 2021-04-19: 30 mg via INTRAVENOUS

## 2021-04-19 MED ORDER — FENTANYL CITRATE (PF) 100 MCG/2ML IJ SOLN
25.0000 ug | INTRAMUSCULAR | Status: DC | PRN
  Administered 2021-04-19: 25 ug via INTRAVENOUS

## 2021-04-19 MED ORDER — ONDANSETRON HCL 4 MG/2ML IJ SOLN
INTRAMUSCULAR | Status: AC
  Filled 2021-04-19: qty 2

## 2021-04-19 MED ORDER — FENTANYL CITRATE (PF) 100 MCG/2ML IJ SOLN
INTRAMUSCULAR | Status: AC
  Filled 2021-04-19: qty 2

## 2021-04-19 MED ORDER — FAMOTIDINE 20 MG PO TABS
ORAL_TABLET | ORAL | Status: AC
  Filled 2021-04-19: qty 1

## 2021-04-19 MED ORDER — SILVER NITRATE-POT NITRATE 75-25 % EX MISC
CUTANEOUS | Status: AC
  Filled 2021-04-19: qty 10

## 2021-04-19 MED ORDER — ONDANSETRON HCL 4 MG/2ML IJ SOLN
INTRAMUSCULAR | Status: DC | PRN
  Administered 2021-04-19: 4 mg via INTRAVENOUS

## 2021-04-19 MED ORDER — GLYCOPYRROLATE 0.2 MG/ML IJ SOLN
INTRAMUSCULAR | Status: DC | PRN
  Administered 2021-04-19: .2 mg via INTRAVENOUS

## 2021-04-19 MED ORDER — ACETAMINOPHEN 10 MG/ML IV SOLN
1000.0000 mg | Freq: Once | INTRAVENOUS | Status: DC | PRN
  Administered 2021-04-19: 1000 mg via INTRAVENOUS

## 2021-04-19 MED ORDER — MIDAZOLAM HCL 2 MG/2ML IJ SOLN
INTRAMUSCULAR | Status: AC
  Filled 2021-04-19: qty 2

## 2021-04-19 MED ORDER — OXYCODONE HCL 5 MG PO TABS
ORAL_TABLET | ORAL | Status: AC
  Administered 2021-04-19: 5 mg via ORAL
  Filled 2021-04-19: qty 1

## 2021-04-19 MED ORDER — 0.9 % SODIUM CHLORIDE (POUR BTL) OPTIME
TOPICAL | Status: DC | PRN
  Administered 2021-04-19: 500 mL

## 2021-04-19 MED ORDER — IBUPROFEN 600 MG PO TABS: 600.0000 mg | ORAL_TABLET | Freq: Four times a day (QID) | ORAL | 0 refills | Status: AC | PRN

## 2021-04-19 MED ORDER — LACTATED RINGERS IV SOLN: INTRAVENOUS | Status: DC

## 2021-04-19 MED ORDER — ACETAMINOPHEN 10 MG/ML IV SOLN
INTRAVENOUS | Status: AC
  Filled 2021-04-19: qty 100

## 2021-04-19 MED ORDER — PROPOFOL 10 MG/ML IV BOLUS
INTRAVENOUS | Status: AC
  Filled 2021-04-19: qty 20

## 2021-04-19 MED ORDER — KETOROLAC TROMETHAMINE 30 MG/ML IJ SOLN
INTRAMUSCULAR | Status: DC | PRN
  Administered 2021-04-19: 30 mg via INTRAVENOUS

## 2021-04-19 MED ORDER — OXYCODONE HCL 5 MG PO TABS: 5.0000 mg | ORAL_TABLET | Freq: Once | ORAL | Status: AC

## 2021-04-19 MED ORDER — POVIDONE-IODINE 10 % EX SWAB
2.0000 "application " | Freq: Once | CUTANEOUS | Status: AC
  Administered 2021-04-19: 2 via TOPICAL

## 2021-04-19 MED ORDER — CHLORHEXIDINE GLUCONATE 0.12 % MT SOLN
15.0000 mL | Freq: Once | OROMUCOSAL | Status: AC
  Administered 2021-04-19: 15 mL via OROMUCOSAL

## 2021-04-19 SURGICAL SUPPLY — 20 items
ABLATOR SURESOUND NOVASURE (ABLATOR) ×1 IMPLANT
BACTOSHIELD CHG 4% 4OZ (MISCELLANEOUS) ×1
DRSG TELFA 3X8 NADH (GAUZE/BANDAGES/DRESSINGS) ×2 IMPLANT
GAUZE 4X4 16PLY ~~LOC~~+RFID DBL (SPONGE) ×3 IMPLANT
GLOVE SURG SYN 6.5 ES PF (GLOVE) ×2 IMPLANT
GLOVE SURG SYN 6.5 PF PI (GLOVE) ×1 IMPLANT
GLOVE SURG UNDER POLY LF SZ6.5 (GLOVE) ×4 IMPLANT
GOWN STRL REUS W/ TWL LRG LVL3 (GOWN DISPOSABLE) ×2 IMPLANT
GOWN STRL REUS W/TWL LRG LVL3 (GOWN DISPOSABLE) ×4
IV NS IRRIG 3000ML ARTHROMATIC (IV SOLUTION) ×2 IMPLANT
KIT PROCEDURE FLUENT (KITS) ×1 IMPLANT
MANIFOLD NEPTUNE II (INSTRUMENTS) ×2 IMPLANT
NS IRRIG 500ML POUR BTL (IV SOLUTION) ×1 IMPLANT
PACK DNC HYST (MISCELLANEOUS) ×2 IMPLANT
PAD DRESSING TELFA 3X8 NADH (GAUZE/BANDAGES/DRESSINGS) IMPLANT
PAD OB MATERNITY 4.3X12.25 (PERSONAL CARE ITEMS) ×2 IMPLANT
PAD PREP 24X41 OB/GYN DISP (PERSONAL CARE ITEMS) ×2 IMPLANT
SCRUB CHG 4% DYNA-HEX 4OZ (MISCELLANEOUS) ×1 IMPLANT
SEAL ROD LENS SCOPE MYOSURE (ABLATOR) ×2 IMPLANT
TOWEL OR 17X26 4PK STRL BLUE (TOWEL DISPOSABLE) ×2 IMPLANT

## 2021-04-19 NOTE — Transfer of Care (Signed)
Immediate Anesthesia Transfer of Care Note  Patient: Kirsten Cruz  Procedure(s) Performed: DILATATION & CURETTAGE/HYSTEROSCOPY WITH NOVASURE ABLATION INTRAUTERINE DEVICE (IUD) REMOVAL  Patient Location: PACU  Anesthesia Type:General  Level of Consciousness: drowsy  Airway & Oxygen Therapy: Patient Spontanous Breathing and Patient connected to face mask oxygen  Post-op Assessment: Report given to RN and Post -op Vital signs reviewed and stable  Post vital signs: Reviewed and stable  Last Vitals:  Vitals Value Taken Time  BP 128/85 04/19/21 1100  Temp    Pulse 94 04/19/21 1103  Resp 27 04/19/21 1103  SpO2 100 % 04/19/21 1103  Vitals shown include unvalidated device data.  Last Pain:  Vitals:   04/19/21 1003  TempSrc: Temporal  PainSc: 0-No pain         Complications: No notable events documented.

## 2021-04-19 NOTE — Anesthesia Postprocedure Evaluation (Signed)
Anesthesia Post Note  Patient: Modesty Rudy  Procedure(s) Performed: DILATATION & CURETTAGE/HYSTEROSCOPY WITH NOVASURE ABLATION INTRAUTERINE DEVICE (IUD) REMOVAL  Patient location during evaluation: PACU Anesthesia Type: General Level of consciousness: awake and alert Pain management: pain level controlled Vital Signs Assessment: post-procedure vital signs reviewed and stable Respiratory status: spontaneous breathing, nonlabored ventilation, respiratory function stable and patient connected to nasal cannula oxygen Cardiovascular status: blood pressure returned to baseline and stable Postop Assessment: no apparent nausea or vomiting Anesthetic complications: no   No notable events documented.   Last Vitals:  Vitals:   04/19/21 1130 04/19/21 1153  BP: (!) 150/82 (!) 158/92  Pulse: 67 (!) 49  Resp: (!) 23 15  Temp:  (!) 36.1 C  SpO2: 100% 97%    Last Pain:  Vitals:   04/19/21 1153  TempSrc: Temporal  PainSc: 6                  Martha Clan

## 2021-04-19 NOTE — Op Note (Signed)
Operative Note  04/19/2021  PRE-OP DIAGNOSIS: Menorrhagia, IUD in place  POST-OP DIAGNOSIS: same,  IUD removed  SURGEON: Adrian Prows MD  PROCEDURE: Procedure(s): DILATATION & CURETTAGE/HYSTEROSCOPY WITH Welcome (IUD) REMOVAL   ANESTHESIA: Choice   ESTIMATED BLOOD LOSS: 5 cc   SPECIMENS:  IUD, endometrial curetting  FLUID DEFICIT: 90 cc  COMPLICATIONS: none  DISPOSITION: PACU - hemodynamically stable.  CONDITION: stable  FINDINGS: Exam under anesthesia revealed  11 cm uterus with bilateral adnexa wihtout masses or fullness. Hysteroscopy revealed a normal uterine cavity with bilateral tubal ostia and normal appearing endocervical canal.  PROCEDURE IN DETAIL: After informed consent was obtained, the patient was taken to the operating room where anesthesia was obtained without difficulty. The patient was positioned in the dorsal lithotomy position in [place]. The patient's bladder was catheterized with an in and out foley catheter. The patient was examined under anesthesia, with the above noted findings. The weightedspeculum was placed inside the patient's vagina, and the the anterior lip of the cervix was seen and grasped with the tenaculum.  The uterine cavity was sounded to 11 cm, and then the cervix was progressively dilated to a 18 French-Pratt dilator. The IUD was grasped with the [facility] grasper and removed intact from the uterine cavity. The 0 degree hysteroscope was introduced, with saline fluid used to distend the intrauterine cavity, with the above noted findings.  The hysteroscope was removed.  The uterine cavity was curetted until a gritty texture was noted, yielding endometrial curettings.   The  endometrial ablation device was then placed without difficulty. Measurements were obtained. Patient was noted to have a uterine length of 6 cm, a cervical length of 5 cm, and a cervical width of 3 cm. The device is first tested and after  confirmation the procedures performed. Length of procedure was 53 seconds. The power was 99 . The ablation device is then removed and repeat hysteroscopy revealed an appropriate lining of the uterus and no perforation or injury.   All instruments were removed, with excellent hemostasis noted throughout. She was then taken out of dorsal lithotomy. Minimal discrepancy in fluid was noted.  The patient tolerated the procedure well. Sponge, lap and needle counts were correct x2. The patient was taken to recovery room in excellent condition.  Adrian Prows MD [facility], [facility] 04/19/2021 11:06 AM

## 2021-04-19 NOTE — Discharge Instructions (Signed)

## 2021-04-19 NOTE — H&P (Signed)
Kirsten Cruz is an 51 y.o. female.   Chief Complaint: Lost IUD strings HPI:   Kirsten Cruz is a 51 y.o. female she is here today for an IUD removal, hysteroscopy D&C and Novasure Ablation.  She reports that she has been having ongoing spotting for several years with her Mirena IUD and is frustrated with the device and would like to have it removed.    Past Medical History:  Diagnosis Date   Anemia    Chronic kidney disease    COVID-19    Heart murmur    many years ago and her cardiologist Dr. Clayborn Bigness is managing this   Hyperlipidemia    Hypertension    Irregular heart beat    Thyroid disease    goiter    Past Surgical History:  Procedure Laterality Date   CESAREAN SECTION  02/11/1997   CHOLECYSTECTOMY  2016   Mirena   07/27/2017   TOE SURGERY Left 1999    Family History  Problem Relation Age of Onset   Breast cancer Mother 68   Cancer Mother    Diabetes Mother    Varicose Veins Mother    Cancer Father    Diabetes Sister    Social History:  reports that she has never smoked. She has never used smokeless tobacco. She reports that she does not drink alcohol and does not use drugs.  Allergies:  Allergies  Allergen Reactions   Buspirone Nausea Only    Other reaction(s): Headache   Pravastatin Other (See Comments)    Pt believes she had a seizure with it.   Galcanezumab-Gnlm     Other reaction(s): Unknown Numbness on side of head   Venlafaxine Nausea Only    Other reaction(s): Headache   Latex Itching and Rash   Morphine Rash   Morphine And Related Rash   Nortriptyline Anxiety    Medications Prior to Admission  Medication Sig Dispense Refill   atorvastatin (LIPITOR) 40 MG tablet Take 40 mg by mouth at bedtime.  3   B Complex Vitamins (B-COMPLEX/B-12) TABS Take 1 tablet by mouth daily.     Fremanezumab-vfrm (AJOVY) 225 MG/1.5ML SOAJ Inject 225 mg into the skin every 30 (thirty) days.     levonorgestrel (MIRENA) 20 MCG/24HR IUD 1 Intra Uterine Device (1  each total) by Intrauterine route once for 1 dose. 1 each 0   propranolol (INDERAL) 10 MG tablet Take 10 mg by mouth 2 (two) times daily.     rizatriptan (MAXALT) 10 MG tablet Take 10 mg by mouth as needed for migraine. May repeat in 2 hours if needed     triamterene-hydrochlorothiazide (DYAZIDE) 37.5-25 MG capsule Take 1 capsule by mouth See admin instructions. Take every 2-3 days     valACYclovir (VALTREX) 500 MG tablet Take 500 mg by mouth daily as needed.      Results for orders placed or performed during the hospital encounter of 04/19/21 (from the past 48 hour(s))  Pregnancy, urine POC     Status: None   Collection Time: 04/19/21  9:31 AM  Result Value Ref Range   Preg Test, Ur NEGATIVE NEGATIVE    Comment:        THE SENSITIVITY OF THIS METHODOLOGY IS >24 mIU/mL    No results found.  Review of Systems  Constitutional:  Negative for chills and fever.  HENT:  Negative for congestion, hearing loss and sinus pain.   Respiratory:  Negative for cough, shortness of breath and wheezing.   Cardiovascular:  Negative  for chest pain, palpitations and leg swelling.  Gastrointestinal:  Negative for abdominal pain, constipation, diarrhea, nausea and vomiting.  Genitourinary:  Negative for dysuria, flank pain, frequency, hematuria and urgency.  Musculoskeletal:  Negative for back pain.  Skin:  Negative for rash.  Neurological:  Negative for dizziness and headaches.  Psychiatric/Behavioral:  Negative for suicidal ideas. The patient is not nervous/anxious.    There were no vitals taken for this visit. Physical Exam Vitals and nursing note reviewed.  Constitutional:      Appearance: Normal appearance. She is well-developed.  HENT:     Head: Normocephalic and atraumatic.  Cardiovascular:     Rate and Rhythm: Normal rate and regular rhythm.  Pulmonary:     Effort: Pulmonary effort is normal.     Breath sounds: Normal breath sounds.  Abdominal:     General: Bowel sounds are normal.      Palpations: Abdomen is soft.  Musculoskeletal:        General: Normal range of motion.  Skin:    General: Skin is warm and dry.  Neurological:     Mental Status: She is alert and oriented to person, place, and time.  Psychiatric:        Behavior: Behavior normal.        Thought Content: Thought content normal.        Judgment: Judgment normal.     Assessment/Plan  51 yo with irregular bleeding, lost IUD strings, desires Novasure ablation and IUD removal.   Patient was told that it is normal to have menstrual bleeding after an endometrial ablation, only about 80% of patients become amenorrheic, 10% of patients have normal or light periods, and 10% of patients have no change in their bleeding pattern and may need further intervention.  She was told she will observe her periods for a few months after her ablation to see what her periods will be like; it is recommended to wait until at least three months after the procedure before making conclusions about how periods are going to be like after an ablation.  All questions answered and consents signed.   Kirsten Fellers, MD 04/19/2021, 10:00 AM

## 2021-04-20 ENCOUNTER — Encounter: Payer: Self-pay | Admitting: Obstetrics and Gynecology

## 2021-04-20 LAB — SURGICAL PATHOLOGY

## 2021-04-26 ENCOUNTER — Telehealth: Payer: Self-pay | Admitting: Licensed Practical Nurse

## 2021-04-26 ENCOUNTER — Telehealth: Payer: Self-pay

## 2021-04-26 NOTE — Telephone Encounter (Signed)
Pt calling; had proc 2/14th c CRS; is still having cramps; they wake her up; she took a tramodol this morning; can't take the 600mg  Motrin she was given; is there anything else she can take?  [phone]  Pt states her PCP doesn't want her to take IBU or motrin d/t her liver; adv sometimes they are okay for short term; adv pt to call her PCP and ask and I will send msg to LMD who is on call to see if there is something else she can take for the cramping; tylenol, heat and lying on a pillow do not work; pt is on probation period for work and is supposed to go to [employer] to p/u equipment.  Pharm correct in chart.

## 2021-04-26 NOTE — Telephone Encounter (Signed)
Pt had a D and C with ablation on 2/14, she continues to have cramping. She has been taking 500mg  Tylenol and Tramadol as prescribed. The cramping is not as bad as it was after the first day of surgery.  But it is still there, it is nagging. She was told by her PCP she can take NSAID due to her liver enzymes. She needs to work tomorrow and wonders if there is anything else she can do.  Also mentioned she has been having night sweats but denies any fevers.  Recommend increasing Tylenol to 1,000 mg every 4-6 hours (do not exceed 4,000mg  in a  24 hour period or mores than 1,000 every 4 hours). Continue with heating pad/hot water bottle.  Encouraged to reach out to her PCP to see if a short term dose of a NSAID is safe.  Has fu with Dr Gilman Schmidt on 3/2, had to cancel 2/27 apt d/t work schedule.  Roberto Scales, CNM  [facility], [facility]  @TODAY @  6:02 PM

## 2021-05-02 ENCOUNTER — Ambulatory Visit: Payer: BC Managed Care – PPO | Admitting: Obstetrics and Gynecology

## 2021-05-05 ENCOUNTER — Encounter: Payer: Self-pay | Admitting: Obstetrics and Gynecology

## 2021-05-05 ENCOUNTER — Other Ambulatory Visit: Payer: Self-pay

## 2021-05-05 ENCOUNTER — Ambulatory Visit (INDEPENDENT_AMBULATORY_CARE_PROVIDER_SITE_OTHER): Payer: BC Managed Care – PPO | Admitting: Obstetrics and Gynecology

## 2021-05-05 VITALS — BP 130/70 | Ht 65.0 in | Wt 194.0 lb

## 2021-05-05 DIAGNOSIS — Z4889 Encounter for other specified surgical aftercare: Secondary | ICD-10-CM

## 2021-05-05 NOTE — Progress Notes (Signed)
?  Postoperative Follow-up ?Patient presents post op from  Hysteroscopy with IUD removal and Novasure ablation   for  menorrhagia , 3 weeks ago. ? ?Subjective: ?She reports that since the surgery she has been having spotting and intermittent cramping. She has noticed a thin watery discharge as well. She is using tylenol for pain as needed. Kirsten Cruz ?Patient reports some improvement in her preop symptoms. Eating a regular diet without difficulty.  ?Activity: normal activities of daily living.  ? ?Objective: ?BP 130/70   Ht 5\' 5"  (1.651 m)   Wt 194 lb (88 kg)   BMI 32.28 kg/m?  ?Physical Exam ?Constitutional:   ?   Appearance: Normal appearance. She is well-developed.  ?HENT:  ?   Head: Normocephalic and atraumatic.  ?Eyes:  ?   Extraocular Movements: Extraocular movements intact.  ?   Pupils: Pupils are equal, round, and reactive to light.  ?Neck:  ?   Thyroid: No thyromegaly.  ?Cardiovascular:  ?   Rate and Rhythm: Normal rate and regular rhythm.  ?   Heart sounds: Normal heart sounds.  ?Pulmonary:  ?   Effort: Pulmonary effort is normal.  ?   Breath sounds: Normal breath sounds.  ?Abdominal:  ?   General: Bowel sounds are normal. There is no distension.  ?   Palpations: Abdomen is soft. There is no mass.  ?Musculoskeletal:  ?   Cervical back: Neck supple.  ?Neurological:  ?   Mental Status: She is alert and oriented to person, place, and time.  ?Skin: ?   General: Skin is warm and dry.  ?Psychiatric:     ?   Behavior: Behavior normal.     ?   Thought Content: Thought content normal.     ?   Judgment: Judgment normal.  ?Vitals reviewed.  ? ? ?Assessment: ?s/p :  Hysteroscopy with IUD removal and Novasure ablation stable ? ?Plan: ? ?Patient has done well after surgery with no apparent complications.  ?  ?I have discussed the post-operative course to date, and the expected progress moving forward.  The patient understands what complications to be concerned about.  I will see the patient in routine follow up, or sooner if  needed.   ? ?Activity plan: No restriction.   ? ?Adrian Prows MD, ?[place] ?05/05/2021 ?3:44 PM ? ? ? ?

## 2021-05-05 NOTE — Telephone Encounter (Signed)
Pt has appt c CRS today at 3:15. ?

## 2021-05-17 ENCOUNTER — Other Ambulatory Visit: Payer: Self-pay | Admitting: Family Medicine

## 2021-05-17 DIAGNOSIS — Z1231 Encounter for screening mammogram for malignant neoplasm of breast: Secondary | ICD-10-CM

## 2021-07-22 ENCOUNTER — Other Ambulatory Visit: Payer: Self-pay

## 2021-07-22 ENCOUNTER — Emergency Department
Admission: EM | Admit: 2021-07-22 | Discharge: 2021-07-22 | Disposition: A | Discharge status: Home / Self Care | Payer: BC Managed Care – PPO | Attending: Emergency Medicine | Admitting: Emergency Medicine

## 2021-07-22 DIAGNOSIS — N952 Postmenopausal atrophic vaginitis: Secondary | ICD-10-CM | POA: Diagnosis not present

## 2021-07-22 DIAGNOSIS — R300 Dysuria: Secondary | ICD-10-CM | POA: Diagnosis present

## 2021-07-22 LAB — WET PREP, GENITAL
Clue Cells Wet Prep HPF POC: NONE SEEN
Sperm: NONE SEEN
Trich, Wet Prep: NONE SEEN
WBC, Wet Prep HPF POC: <10 (ref <10)
Yeast Wet Prep HPF POC: NONE SEEN

## 2021-07-22 LAB — URINALYSIS, ROUTINE W REFLEX MICROSCOPIC
Bilirubin Urine: NEGATIVE
Glucose, UA: NEGATIVE mg/dL
Hgb urine dipstick: NEGATIVE
Ketones, ur: NEGATIVE mg/dL
Leukocytes,Ua: NEGATIVE
Nitrite: NEGATIVE
Protein, ur: NEGATIVE mg/dL
Specific Gravity, Urine: 1.005 (ref 1.005–1.030)
pH: 5 (ref 5.0–8.0)

## 2021-07-22 LAB — CHLAMYDIA/NGC RT PCR (ARMC ONLY)
Chlamydia Tr: NOT DETECTED
N gonorrhoeae: NOT DETECTED

## 2021-07-22 MED ORDER — ESTROGENS CONJUGATED 0.625 MG/GM VA CREA: 1.0000 | TOPICAL_CREAM | Freq: Every day | VAGINAL | 12 refills | Status: AC

## 2021-07-22 NOTE — ED Triage Notes (Signed)
Pt to ED for feeling of pelvic pressure and dysuria (burning and frequency) that started about 2 weeks ago after had endometrial ablation to correct abnormal uterine bleeding and IUD complication (IUD was removed after getting "lost").  Pt states was having BM yesterday and it felt like "something was falling out" of her vagina. States "it just hasn't been right down there" since the ablation procedure 2 weeks ago.  PA at bedside.

## 2021-07-22 NOTE — ED Provider Triage Note (Signed)
Emergency Medicine Provider Triage Evaluation Note  Kirsten Cruz, a 51 y.o. female  was evaluated in triage.  Pt complains of vaginal fullness and pressure.  Patient notes onset yesterday after she had to strain to have a bowel movement patient denies any frank bleeding or discharge from the vagina.  She also denies any rectal pain or discomfort.  She denies any dysuria, hematuria, or urinary retention.  She is 2 weeks status post uterine ablation for abnormal uterine bleeding.  Review of Systems  Positive: Pelvic fullness Negative: Vaginal bleeding/discharge, urinary retention  Physical Exam  BP 137/72   Pulse 86   Temp 98.2 F (36.8 C) (Oral)   Resp 16   Ht '5\' 5"'$  (1.651 m)   Wt 86.6 kg   SpO2 98%   BMI 31.78 kg/m  Gen:   Awake, no distress   Resp:  Normal effort  MSK:   Moves extremities without difficulty  CVS:  RRR  Medical Decision Making  Medically screening exam initiated at 4:17 PM.  Appropriate orders placed.  Kirsten Cruz was informed that the remainder of the evaluation will be completed by another provider, this initial triage assessment does not replace that evaluation, and the importance of remaining in the ED until their evaluation is complete.  Patient to the ED for evaluation of pelvic and vaginal fullness and pressure.   Kirsten Needles, PA-C 07/22/21 1619

## 2021-07-22 NOTE — ED Provider Notes (Signed)
[facility] Provider Note  Patient Contact: 6:01 PM (approximate)   History   Dysuria and pelvic pressure   HPI  Kirsten Cruz is a 51 y.o. female with history D&C for menorrhagia on 04/19/2021 presents to the emergency department with persistent dysuria.  Patient denies increased urinary frequency or low back pain.  No changes in vaginal discharge.  Patient states that she does have regular periods.  She denies nausea, vomiting or abdominal pain.  No fever.  She does endorse a sensation of vaginal fullness with standing but denies history of prolapse.     Physical Exam   Triage Vital Signs: ED Triage Vitals  Enc Vitals Group     BP 07/22/21 1614 137/72     Pulse Rate 07/22/21 1614 86     Resp 07/22/21 1614 16     Temp 07/22/21 1614 98.2 F (36.8 C)     Temp Source 07/22/21 1614 Oral     SpO2 07/22/21 1614 98 %     Weight 07/22/21 1615 191 lb (86.6 kg)     Height 07/22/21 1615 '5\' 5"'$  (1.651 m)     Head Circumference --      Peak Flow --      Pain Score 07/22/21 1615 0     Pain Loc --      Pain Edu? --      Excl. in [place]? --     Most recent vital signs: Vitals:   07/22/21 1614 07/22/21 2015  BP: 137/72 135/78  Pulse: 86 80  Resp: 16 17  Temp: 98.2 F (36.8 C) 98.3 F (36.8 C)  SpO2: 98% 100%     General: Alert and in no acute distress. Eyes:  PERRL. EOMI. Head: No acute traumatic findings ENT:      Nose: No congestion/rhinnorhea.      Mouth/Throat: Mucous membranes are moist. Neck: No stridor. No cervical spine tenderness to palpation. Cardiovascular:  Good peripheral perfusion Respiratory: Normal respiratory effort without tachypnea or retractions. Lungs CTAB. Good air entry to the bases with no decreased or absent breath sounds. Gastrointestinal: Bowel sounds 4 quadrants. Soft and nontender to palpation. No guarding or rigidity. No palpable masses. No distention. No CVA tenderness. Genitourinary: Atrophy of the labia majora.  No  cervical motion tenderness. Musculoskeletal: Full range of motion to all extremities.  Neurologic:  No gross focal neurologic deficits are appreciated.  Skin:   No rash noted Other:   ED Results / Procedures / Treatments   Labs (all labs ordered are listed, but only abnormal results are displayed) Labs Reviewed  URINALYSIS, ROUTINE W REFLEX MICROSCOPIC - Abnormal; Notable for the following components:      Result Value   Color, Urine STRAW (*)    APPearance CLEAR (*)    All other components within normal limits  WET PREP, GENITAL  CHLAMYDIA/NGC RT PCR ([facility] ONLY)                PROCEDURES:  Critical Care performed: No  Procedures   MEDICATIONS ORDERED IN ED: Medications - No data to display   IMPRESSION / MDM / [place] / ED COURSE  I reviewed the triage vital signs and the nursing notes.                              Assessment and plan: Dysuria:  Prolapse, UTI, BV, yeast vaginitis Assessment and plan: Dysuria:  51 year old female presents  to the emergency department with intermittent dysuria since having her D&C on 214.  Vital signs are reassuring at triage.  On physical exam, patient was alert, active and nontoxic-appearing.  Wet prep negative.  No signs of prolapse on exam.  No signs of UTI on urinalysis.  Physical exam findings suggestive of atrophic vaginitis.  Will prescribe patient topical estrogen and have her follow-up with primary care.     FINAL CLINICAL IMPRESSION(S) / ED DIAGNOSES   Final diagnoses:  Atrophic vaginitis     Rx / DC Orders   ED Discharge Orders          Ordered    conjugated estrogens (PREMARIN) vaginal cream  Daily        07/22/21 2004             Note:  This document was prepared using Dragon voice recognition software and may include unintentional dictation errors.   Vallarie Mare [employer], PA-C 07/22/21 2029    Vanessa Chatham, MD 07/23/21 [phone]

## 2021-09-19 ENCOUNTER — Ambulatory Visit: Payer: BC Managed Care – PPO

## 2021-10-31 ENCOUNTER — Ambulatory Visit
Admission: RE | Admit: 2021-10-31 | Discharge: 2021-10-31 | Disposition: A | Discharge status: Home / Self Care | Payer: BC Managed Care – PPO | Source: Ambulatory Visit | Attending: Family Medicine | Admitting: Family Medicine

## 2021-10-31 DIAGNOSIS — Z1231 Encounter for screening mammogram for malignant neoplasm of breast: Secondary | ICD-10-CM | POA: Insufficient documentation

## 2021-11-29 ENCOUNTER — Other Ambulatory Visit (HOSPITAL_COMMUNITY): Payer: Self-pay | Admitting: Physician Assistant

## 2021-11-29 ENCOUNTER — Other Ambulatory Visit: Payer: Self-pay | Admitting: Physician Assistant

## 2021-11-29 DIAGNOSIS — R519 Headache, unspecified: Secondary | ICD-10-CM

## 2021-12-06 ENCOUNTER — Other Ambulatory Visit: Payer: Self-pay | Admitting: Physician Assistant

## 2021-12-06 DIAGNOSIS — R519 Headache, unspecified: Secondary | ICD-10-CM

## 2021-12-07 ENCOUNTER — Ambulatory Visit: Admission: RE | Admit: 2021-12-07 | Payer: BC Managed Care – PPO | Source: Ambulatory Visit

## 2021-12-10 ENCOUNTER — Other Ambulatory Visit: Payer: BC Managed Care – PPO

## 2021-12-17 ENCOUNTER — Ambulatory Visit
Admission: RE | Admit: 2021-12-17 | Discharge: 2021-12-17 | Disposition: A | Discharge status: Home / Self Care | Payer: BC Managed Care – PPO | Source: Ambulatory Visit | Attending: Physician Assistant | Admitting: Physician Assistant

## 2021-12-17 DIAGNOSIS — R519 Headache, unspecified: Secondary | ICD-10-CM

## 2022-01-02 ENCOUNTER — Encounter (INDEPENDENT_AMBULATORY_CARE_PROVIDER_SITE_OTHER): Payer: Self-pay

## 2023-03-14 ENCOUNTER — Other Ambulatory Visit: Payer: Self-pay | Admitting: Family Medicine

## 2023-03-14 DIAGNOSIS — Z1231 Encounter for screening mammogram for malignant neoplasm of breast: Secondary | ICD-10-CM

## 2023-10-04 ENCOUNTER — Ambulatory Visit: Payer: Self-pay

## 2023-11-06 ENCOUNTER — Ambulatory Visit: Payer: Self-pay

## 2023-12-07 ENCOUNTER — Ambulatory Visit
Admission: RE | Admit: 2023-12-07 | Discharge: 2023-12-07 | Disposition: A | Discharge status: Home / Self Care | Payer: Self-pay | Source: Ambulatory Visit | Attending: Family Medicine | Admitting: Family Medicine

## 2023-12-07 DIAGNOSIS — Z1231 Encounter for screening mammogram for malignant neoplasm of breast: Secondary | ICD-10-CM | POA: Diagnosis present
